# Patient Record
Sex: Female | Born: 1950
Health system: Southern US, Community
[De-identification: ages and names within clinical notes are randomized; demographics above are authoritative.]

## PROBLEM LIST (undated history)

## (undated) DIAGNOSIS — F419 Anxiety disorder, unspecified: Secondary | ICD-10-CM

## (undated) DIAGNOSIS — F329 Major depressive disorder, single episode, unspecified: Secondary | ICD-10-CM

## (undated) DIAGNOSIS — E119 Type 2 diabetes mellitus without complications: Secondary | ICD-10-CM

## (undated) DIAGNOSIS — E039 Hypothyroidism, unspecified: Secondary | ICD-10-CM

## (undated) DIAGNOSIS — E7800 Pure hypercholesterolemia, unspecified: Secondary | ICD-10-CM

## (undated) DIAGNOSIS — K589 Irritable bowel syndrome without diarrhea: Secondary | ICD-10-CM

## (undated) DIAGNOSIS — F32A Depression, unspecified: Secondary | ICD-10-CM

## (undated) DIAGNOSIS — T7840XA Allergy, unspecified, initial encounter: Secondary | ICD-10-CM

## (undated) DIAGNOSIS — I10 Essential (primary) hypertension: Secondary | ICD-10-CM

## (undated) HISTORY — DX: Type 2 diabetes mellitus without complications: E11.9

## (undated) HISTORY — DX: Hypothyroidism, unspecified: E03.9

## (undated) HISTORY — DX: Major depressive disorder, single episode, unspecified: F32.9

## (undated) HISTORY — DX: Essential (primary) hypertension: I10

## (undated) HISTORY — DX: Allergy, unspecified, initial encounter: T78.40XA

## (undated) HISTORY — PX: THYROIDECTOMY: SHX17

## (undated) HISTORY — DX: Irritable bowel syndrome, unspecified: K58.9

## (undated) HISTORY — PX: HERNIA REPAIR: SHX51

## (undated) HISTORY — PX: ABDOMINAL PERINEAL BOWEL RESECTION: SHX1111

## (undated) HISTORY — DX: Depression, unspecified: F32.A

## (undated) HISTORY — PX: APPENDECTOMY: SHX54

## (undated) HISTORY — DX: Anxiety disorder, unspecified: F41.9

## (undated) HISTORY — DX: Pure hypercholesterolemia, unspecified: E78.00

---

## 2008-01-03 ENCOUNTER — Other Ambulatory Visit: Payer: Self-pay

## 2008-01-03 ENCOUNTER — Emergency Department: Payer: Self-pay | Admitting: Emergency Medicine

## 2008-01-20 ENCOUNTER — Ambulatory Visit: Payer: Self-pay | Admitting: Family Medicine

## 2008-02-16 ENCOUNTER — Ambulatory Visit: Payer: Self-pay | Admitting: Family Medicine

## 2008-03-26 ENCOUNTER — Ambulatory Visit: Payer: Self-pay | Admitting: Internal Medicine

## 2008-04-12 ENCOUNTER — Ambulatory Visit: Payer: Self-pay | Admitting: Internal Medicine

## 2008-04-25 ENCOUNTER — Ambulatory Visit: Payer: Self-pay | Admitting: Family Medicine

## 2008-04-25 ENCOUNTER — Ambulatory Visit: Payer: Self-pay | Admitting: Internal Medicine

## 2008-04-27 ENCOUNTER — Ambulatory Visit: Payer: Self-pay | Admitting: Family Medicine

## 2008-05-26 ENCOUNTER — Ambulatory Visit: Payer: Self-pay | Admitting: Internal Medicine

## 2008-06-26 ENCOUNTER — Ambulatory Visit: Payer: Self-pay | Admitting: Internal Medicine

## 2008-07-13 ENCOUNTER — Ambulatory Visit: Payer: Self-pay | Admitting: Internal Medicine

## 2008-07-24 ENCOUNTER — Ambulatory Visit: Payer: Self-pay | Admitting: Internal Medicine

## 2009-01-31 ENCOUNTER — Ambulatory Visit: Payer: Self-pay | Admitting: Family Medicine

## 2009-08-06 ENCOUNTER — Ambulatory Visit: Payer: Self-pay | Admitting: Family Medicine

## 2010-02-05 ENCOUNTER — Ambulatory Visit: Payer: Self-pay | Admitting: Family Medicine

## 2011-03-05 ENCOUNTER — Ambulatory Visit: Payer: Self-pay | Admitting: Family Medicine

## 2011-09-15 ENCOUNTER — Ambulatory Visit: Payer: Self-pay | Admitting: Family Medicine

## 2012-04-13 ENCOUNTER — Ambulatory Visit: Payer: Self-pay | Admitting: Family Medicine

## 2012-07-22 ENCOUNTER — Ambulatory Visit: Payer: Self-pay | Admitting: Family Medicine

## 2012-10-06 ENCOUNTER — Ambulatory Visit: Payer: Self-pay | Admitting: Gastroenterology

## 2012-10-21 LAB — HM PAP SMEAR: HM PAP: NEGATIVE

## 2012-11-22 ENCOUNTER — Ambulatory Visit: Payer: Self-pay | Admitting: Gastroenterology

## 2013-06-07 ENCOUNTER — Ambulatory Visit: Payer: Self-pay | Admitting: Family Medicine

## 2014-04-24 ENCOUNTER — Encounter: Payer: Self-pay | Admitting: Podiatry

## 2014-04-24 ENCOUNTER — Other Ambulatory Visit: Payer: Self-pay | Admitting: *Deleted

## 2014-04-24 ENCOUNTER — Ambulatory Visit (INDEPENDENT_AMBULATORY_CARE_PROVIDER_SITE_OTHER): Payer: Managed Care, Other (non HMO)

## 2014-04-24 ENCOUNTER — Ambulatory Visit (INDEPENDENT_AMBULATORY_CARE_PROVIDER_SITE_OTHER): Payer: Managed Care, Other (non HMO) | Admitting: Podiatry

## 2014-04-24 VITALS — BP 131/81 | HR 76 | Resp 16 | Ht 66.0 in | Wt 220.0 lb

## 2014-04-24 DIAGNOSIS — L603 Nail dystrophy: Secondary | ICD-10-CM

## 2014-04-24 DIAGNOSIS — L600 Ingrowing nail: Secondary | ICD-10-CM

## 2014-04-24 DIAGNOSIS — E119 Type 2 diabetes mellitus without complications: Secondary | ICD-10-CM

## 2014-04-24 MED ORDER — NEOMYCIN-POLYMYXIN-HC 3.5-10000-1 OT SOLN: OTIC | Status: DC

## 2014-04-24 NOTE — Patient Instructions (Signed)

## 2014-04-24 NOTE — Progress Notes (Signed)
   Subjective:    Patient ID: Christine Jensen, female    DOB: 1951-02-18, 63 y.o.   MRN: [id number]  HPI Comments: Toenail is discolored and mishaped. No pain. Have used a topical before. It has spread to the finger nails  Diabetes and the last a1c was a 6.2      Review of Systems  Constitutional: Positive for fatigue and unexpected weight change.  Gastrointestinal: Positive for abdominal pain and constipation.  Allergic/Immunologic: Positive for environmental allergies and food allergies.  Hematological: Bruises/bleeds easily.       Objective:   Physical Exam: I have reviewed her past medical history medications allergies surgery social history and review of systems. Pulses are strongly palpable bilateral. Neurologic sensorium is intact per Semmes-Weinstein monofilament. Deep tendon reflexes are intact bilateral muscle strength is 5 over 5 dorsiflexion plantar flexors and inverters and everters all intrinsic musculature is intact. Orthopedic evaluation and straights all joints distal to the ankle for range of motion without crepitation. Cutaneous evaluation demonstrates hallux nail plate left with a subungual contusion floating on a serosanguineous fluid collection. The nail does appear to be thickened and discolored possibly mycotic as well.        Assessment & Plan:  Assessment: Subungual abscess ingrown nail hallux left. Rule out onychomycosis.  Plan: Total nail avulsion was performed today to the hallux left after local anesthetic was administered. She tolerated the procedure well and will start soaking twice daily tomorrow and Betadine warm water. The nail plate will be sent for pathologic evaluation and we will follow-up with her in 1 week

## 2014-04-25 ENCOUNTER — Telehealth: Payer: Self-pay | Admitting: *Deleted

## 2014-04-25 NOTE — Telephone Encounter (Signed)
Pt called and stated her pharmacy [facility] [address] called her and told her ear drops were ready to be picked up. Pt and i discussed that dr Milinda Pointer did not use the chemical and the pt stated that dr Milinda Pointer told her the nail would grow back. Told pt she did not need to pick up drops. Pt understood.

## 2014-05-08 ENCOUNTER — Ambulatory Visit (INDEPENDENT_AMBULATORY_CARE_PROVIDER_SITE_OTHER): Payer: Managed Care, Other (non HMO) | Admitting: Podiatry

## 2014-05-08 ENCOUNTER — Encounter: Payer: Self-pay | Admitting: Podiatry

## 2014-05-08 VITALS — BP 128/80 | HR 84 | Resp 16

## 2014-05-08 DIAGNOSIS — Z79899 Other long term (current) drug therapy: Secondary | ICD-10-CM

## 2014-05-08 MED ORDER — TERBINAFINE HCL 250 MG PO TABS: 250.0000 mg | ORAL_TABLET | Freq: Every day | ORAL | Status: DC

## 2014-05-08 NOTE — Progress Notes (Signed)
She presents today for follow-up of nail avulsion hallux left. Her pathology report did come back positive for fungus however she does not want to take anything orally.  Objective: Vital signs are stable she is alert and oriented 3. No erythema or edema cellulitis drainage or odor to nail avulsion site hallux left.  Assessment: Well-healed surgical toe hallux left.  Plan: Discussed onychomycosis and is also pathology with her today however she does not want take any oral therapy.

## 2014-06-06 DIAGNOSIS — Z630 Problems in relationship with spouse or partner: Secondary | ICD-10-CM | POA: Diagnosis not present

## 2014-06-08 DIAGNOSIS — G629 Polyneuropathy, unspecified: Secondary | ICD-10-CM | POA: Diagnosis not present

## 2014-06-08 DIAGNOSIS — E785 Hyperlipidemia, unspecified: Secondary | ICD-10-CM | POA: Diagnosis not present

## 2014-06-08 DIAGNOSIS — E1140 Type 2 diabetes mellitus with diabetic neuropathy, unspecified: Secondary | ICD-10-CM | POA: Diagnosis not present

## 2014-06-08 DIAGNOSIS — F418 Other specified anxiety disorders: Secondary | ICD-10-CM | POA: Diagnosis not present

## 2014-06-08 DIAGNOSIS — I10 Essential (primary) hypertension: Secondary | ICD-10-CM | POA: Diagnosis not present

## 2014-06-08 DIAGNOSIS — G4700 Insomnia, unspecified: Secondary | ICD-10-CM | POA: Diagnosis not present

## 2014-06-20 DIAGNOSIS — Z630 Problems in relationship with spouse or partner: Secondary | ICD-10-CM | POA: Diagnosis not present

## 2014-07-06 DIAGNOSIS — Z630 Problems in relationship with spouse or partner: Secondary | ICD-10-CM | POA: Diagnosis not present

## 2014-07-27 DIAGNOSIS — Z658 Other specified problems related to psychosocial circumstances: Secondary | ICD-10-CM | POA: Diagnosis not present

## 2014-08-01 ENCOUNTER — Ambulatory Visit: Payer: Self-pay | Admitting: Family Medicine

## 2014-08-01 DIAGNOSIS — Z1231 Encounter for screening mammogram for malignant neoplasm of breast: Secondary | ICD-10-CM | POA: Diagnosis not present

## 2014-08-03 DIAGNOSIS — L301 Dyshidrosis [pompholyx]: Secondary | ICD-10-CM | POA: Diagnosis not present

## 2014-08-03 DIAGNOSIS — L400 Psoriasis vulgaris: Secondary | ICD-10-CM | POA: Diagnosis not present

## 2014-08-10 DIAGNOSIS — F411 Generalized anxiety disorder: Secondary | ICD-10-CM | POA: Diagnosis not present

## 2014-08-17 ENCOUNTER — Ambulatory Visit: Payer: Self-pay | Admitting: Gastroenterology

## 2014-08-17 DIAGNOSIS — K648 Other hemorrhoids: Secondary | ICD-10-CM | POA: Diagnosis not present

## 2014-08-17 DIAGNOSIS — Z91048 Other nonmedicinal substance allergy status: Secondary | ICD-10-CM | POA: Diagnosis not present

## 2014-08-17 DIAGNOSIS — Z888 Allergy status to other drugs, medicaments and biological substances status: Secondary | ICD-10-CM | POA: Diagnosis not present

## 2014-08-17 DIAGNOSIS — Z885 Allergy status to narcotic agent status: Secondary | ICD-10-CM | POA: Diagnosis not present

## 2014-08-17 DIAGNOSIS — K921 Melena: Secondary | ICD-10-CM | POA: Diagnosis not present

## 2014-08-17 DIAGNOSIS — Z880 Allergy status to penicillin: Secondary | ICD-10-CM | POA: Diagnosis not present

## 2014-08-17 DIAGNOSIS — D120 Benign neoplasm of cecum: Secondary | ICD-10-CM | POA: Diagnosis not present

## 2014-08-17 DIAGNOSIS — K589 Irritable bowel syndrome without diarrhea: Secondary | ICD-10-CM | POA: Diagnosis not present

## 2014-08-17 DIAGNOSIS — Z91018 Allergy to other foods: Secondary | ICD-10-CM | POA: Diagnosis not present

## 2014-08-17 DIAGNOSIS — Z882 Allergy status to sulfonamides status: Secondary | ICD-10-CM | POA: Diagnosis not present

## 2014-08-28 DIAGNOSIS — L400 Psoriasis vulgaris: Secondary | ICD-10-CM | POA: Diagnosis not present

## 2014-08-30 DIAGNOSIS — L400 Psoriasis vulgaris: Secondary | ICD-10-CM | POA: Diagnosis not present

## 2014-08-30 DIAGNOSIS — R1011 Right upper quadrant pain: Secondary | ICD-10-CM | POA: Diagnosis not present

## 2014-08-30 DIAGNOSIS — G8929 Other chronic pain: Secondary | ICD-10-CM | POA: Diagnosis not present

## 2014-08-30 DIAGNOSIS — M5414 Radiculopathy, thoracic region: Secondary | ICD-10-CM | POA: Diagnosis not present

## 2014-08-30 DIAGNOSIS — B0229 Other postherpetic nervous system involvement: Secondary | ICD-10-CM | POA: Diagnosis not present

## 2014-09-01 DIAGNOSIS — F411 Generalized anxiety disorder: Secondary | ICD-10-CM | POA: Diagnosis not present

## 2014-09-04 DIAGNOSIS — L400 Psoriasis vulgaris: Secondary | ICD-10-CM | POA: Diagnosis not present

## 2014-09-05 ENCOUNTER — Ambulatory Visit
Admit: 2014-09-05 | Disposition: A | Discharge status: Home / Self Care | Payer: Self-pay | Attending: Family Medicine | Admitting: Family Medicine

## 2014-09-05 DIAGNOSIS — R1011 Right upper quadrant pain: Secondary | ICD-10-CM | POA: Diagnosis not present

## 2014-09-05 DIAGNOSIS — K760 Fatty (change of) liver, not elsewhere classified: Secondary | ICD-10-CM | POA: Diagnosis not present

## 2014-09-06 DIAGNOSIS — L400 Psoriasis vulgaris: Secondary | ICD-10-CM | POA: Diagnosis not present

## 2014-09-11 DIAGNOSIS — L400 Psoriasis vulgaris: Secondary | ICD-10-CM | POA: Diagnosis not present

## 2014-09-13 DIAGNOSIS — L400 Psoriasis vulgaris: Secondary | ICD-10-CM | POA: Diagnosis not present

## 2014-09-15 DIAGNOSIS — E7800 Pure hypercholesterolemia, unspecified: Secondary | ICD-10-CM | POA: Insufficient documentation

## 2014-09-15 DIAGNOSIS — F32A Depression, unspecified: Secondary | ICD-10-CM

## 2014-09-15 DIAGNOSIS — F329 Major depressive disorder, single episode, unspecified: Secondary | ICD-10-CM

## 2014-09-15 DIAGNOSIS — E039 Hypothyroidism, unspecified: Secondary | ICD-10-CM | POA: Insufficient documentation

## 2014-09-15 DIAGNOSIS — F419 Anxiety disorder, unspecified: Principal | ICD-10-CM

## 2014-09-15 DIAGNOSIS — F411 Generalized anxiety disorder: Secondary | ICD-10-CM | POA: Diagnosis not present

## 2014-09-18 DIAGNOSIS — L400 Psoriasis vulgaris: Secondary | ICD-10-CM | POA: Diagnosis not present

## 2014-09-19 ENCOUNTER — Ambulatory Visit
Admit: 2014-09-19 | Disposition: A | Discharge status: Home / Self Care | Payer: Self-pay | Attending: Family Medicine | Admitting: Family Medicine

## 2014-09-19 DIAGNOSIS — M40204 Unspecified kyphosis, thoracic region: Secondary | ICD-10-CM | POA: Diagnosis not present

## 2014-09-19 DIAGNOSIS — M546 Pain in thoracic spine: Secondary | ICD-10-CM | POA: Diagnosis not present

## 2014-09-19 DIAGNOSIS — M5124 Other intervertebral disc displacement, thoracic region: Secondary | ICD-10-CM | POA: Diagnosis not present

## 2014-09-20 DIAGNOSIS — L400 Psoriasis vulgaris: Secondary | ICD-10-CM | POA: Diagnosis not present

## 2014-09-25 DIAGNOSIS — L400 Psoriasis vulgaris: Secondary | ICD-10-CM | POA: Diagnosis not present

## 2014-09-27 DIAGNOSIS — L400 Psoriasis vulgaris: Secondary | ICD-10-CM | POA: Diagnosis not present

## 2014-10-04 DIAGNOSIS — Z630 Problems in relationship with spouse or partner: Secondary | ICD-10-CM | POA: Diagnosis not present

## 2014-10-10 DIAGNOSIS — E1140 Type 2 diabetes mellitus with diabetic neuropathy, unspecified: Secondary | ICD-10-CM | POA: Diagnosis not present

## 2014-10-10 DIAGNOSIS — E669 Obesity, unspecified: Secondary | ICD-10-CM | POA: Diagnosis not present

## 2014-10-10 DIAGNOSIS — B0229 Other postherpetic nervous system involvement: Secondary | ICD-10-CM | POA: Diagnosis not present

## 2014-10-10 DIAGNOSIS — F418 Other specified anxiety disorders: Secondary | ICD-10-CM | POA: Diagnosis not present

## 2014-10-10 DIAGNOSIS — Z713 Dietary counseling and surveillance: Secondary | ICD-10-CM | POA: Diagnosis not present

## 2014-10-10 DIAGNOSIS — R222 Localized swelling, mass and lump, trunk: Secondary | ICD-10-CM | POA: Diagnosis not present

## 2014-10-10 DIAGNOSIS — M546 Pain in thoracic spine: Secondary | ICD-10-CM | POA: Diagnosis not present

## 2014-10-10 DIAGNOSIS — R110 Nausea: Secondary | ICD-10-CM | POA: Diagnosis not present

## 2014-10-20 DIAGNOSIS — Z658 Other specified problems related to psychosocial circumstances: Secondary | ICD-10-CM | POA: Diagnosis not present

## 2014-10-24 DIAGNOSIS — M5414 Radiculopathy, thoracic region: Secondary | ICD-10-CM | POA: Diagnosis not present

## 2014-10-24 DIAGNOSIS — M546 Pain in thoracic spine: Secondary | ICD-10-CM | POA: Diagnosis not present

## 2014-10-24 DIAGNOSIS — Z6833 Body mass index (BMI) 33.0-33.9, adult: Secondary | ICD-10-CM | POA: Diagnosis not present

## 2014-10-31 ENCOUNTER — Encounter: Payer: Self-pay | Admitting: Obstetrics and Gynecology

## 2014-11-10 ENCOUNTER — Ambulatory Visit (INDEPENDENT_AMBULATORY_CARE_PROVIDER_SITE_OTHER): Payer: Medicare Other | Admitting: Licensed Clinical Social Worker

## 2014-11-10 DIAGNOSIS — F331 Major depressive disorder, recurrent, moderate: Secondary | ICD-10-CM | POA: Diagnosis not present

## 2014-11-10 DIAGNOSIS — F411 Generalized anxiety disorder: Secondary | ICD-10-CM | POA: Diagnosis not present

## 2014-11-10 NOTE — Progress Notes (Signed)
   THERAPIST PROGRESS NOTE  Session Time: 1:00 p.m. -  2:20 p.m.  Participation Level: Active  Behavioral Response: NeatAlertAnxious and Depressed  Type of Therapy: Individual Therapy  Treatment Goals addressed: Coping  Interventions: Strength-based, Supportive and Reframing  Summary: Christine Jensen is a 64 y.o. female who presents with ongoing depression and anxiety further exacerbated by care-giving responsibilities of spouse with cognitive impairments that often result in behavioral difficulties.  "It's gotten worse but I think I'm coping with it better." Recent disappointment around feeling a sense of loss in a long term relationship with female peer who has promised to visit client in [place] for years.  There was not follow through despite the friend and her family making multiple trips that had them coming through [facility].  Christine Jensen sent the friend an e-mail expressing her frustration and disappointment. This has resulted per client in feeling more alone since she shared her emotions with the friend since her husband's condition has worsened. "I no longer have my best friend."  On a positive note, client is looking forward to a visit from her niece in July and client and friend who lives in [place] may take a trip with the husband to [place], [place].  In addition, their son continues to enjoy school and is attending summer school and the two of them have had interesting conversations around the topics of his projects.  As she and son become closer, this has caused her spouse to notice that he may not be as included in their conversations as he was in the past per client.  Other themes discussed around whether or not her expectations of others are realistic or not which led to Christine Jensen sharing more about family of origin where both parents had very high expectations and client now questioning "Were they wrong?"  She reflected on her generation and the social and political challenges faced and how she has  difficulty accepting the apathy of current generations and conformist beliefs. Grief reactions and loneliness/social isolation are additional areas negatively impacting Christine Jensen's life.  Client talked about needing additional information and emotional support for herself as husband's care-giver.  She shared that her spouse receives some type of treatment at [facility] that is specifically for survivors of 911 yet her husband is unable to remember to ask about family support groups.  No additional concerns or changes in overall symptom presentation remains slightly improved except her sleep cycle.  Christine Jensen was not as tearful in session today as she has been in previous sessions.  Suicidal/Homicidal: Negativewithout intent/plan  Therapist Response:  LCSW encouraged sharing feelings of depression in order to clarify them and gain insight as to causes. Supportive therapy with insight and focused on client's strengths and resourcefulness while also validating the emotional, social and physical toile this is taking on her. LCSW offered education about common irrational fears and beliefs that contribute to anxiety.  Assisted client to begin to increase insight and identification into patterns of certain behaviors and the resulting consequences.  Psycho-education on understanding disappointment in others and strategies for letting go of negative beliefs and feelings attached to these relationships to become healthier.   Plan: Return again in two weeks.  Diagnosis: Major Depressive Disorder, Recurrent, Moderate   Generalized Anxiety Disorder    Miguel Dibble, LCSW 11/10/2014

## 2014-11-20 ENCOUNTER — Encounter: Payer: Self-pay | Admitting: *Deleted

## 2014-11-22 ENCOUNTER — Ambulatory Visit (INDEPENDENT_AMBULATORY_CARE_PROVIDER_SITE_OTHER): Payer: Medicare Other | Admitting: Obstetrics and Gynecology

## 2014-11-22 ENCOUNTER — Encounter: Payer: Self-pay | Admitting: Obstetrics and Gynecology

## 2014-11-22 VITALS — BP 109/79 | HR 82 | Ht 66.0 in | Wt 209.6 lb

## 2014-11-22 DIAGNOSIS — K6289 Other specified diseases of anus and rectum: Secondary | ICD-10-CM

## 2014-11-22 DIAGNOSIS — Z01419 Encounter for gynecological examination (general) (routine) without abnormal findings: Secondary | ICD-10-CM | POA: Diagnosis not present

## 2014-11-22 DIAGNOSIS — Z0000 Encounter for general adult medical examination without abnormal findings: Secondary | ICD-10-CM | POA: Diagnosis not present

## 2014-11-22 MED ORDER — CLOBETASOL PROPIONATE 0.05 % EX OINT: 1.0000 "application " | TOPICAL_OINTMENT | Freq: Two times a day (BID) | CUTANEOUS | Status: DC

## 2014-11-22 NOTE — Patient Instructions (Signed)
Fall Prevention and Home Safety Falls cause injuries and can affect all age groups. It is possible to prevent falls.  HOW TO PREVENT FALLS  Wear shoes with rubber soles that do not have an opening for your toes.  Keep the inside and outside of your house well lit.  Use night lights throughout your home.  Remove clutter from floors.  Clean up floor spills.  Remove throw rugs or fasten them to the floor with carpet tape.  Do not place electrical cords across pathways.  Put grab bars by your tub, shower, and toilet. Do not use towel bars as grab bars.  Put handrails on both sides of the stairway. Fix loose handrails.  Do not climb on stools or stepladders, if possible.  Do not wax your floors.  Repair uneven or unsafe sidewalks, walkways, or stairs.  Keep items you use a lot within reach.  Be aware of pets.  Keep emergency numbers next to the telephone.  Put smoke detectors in your home and near bedrooms. Ask your doctor what other things you can do to prevent falls. Document Released: 03/08/2009 Document Revised: 11/11/2011 Document Reviewed: 08/12/2011 [facility] Patient Information 2015 [place], [place]. This information is not intended to replace advice given to you by your health care provider. Make sure you discuss any questions you have with your health care provider.  Health Maintenance Adopting a healthy lifestyle and getting preventive care can go a long way to promote health and wellness. Talk with your health care provider about what schedule of regular examinations is right for you. This is a good chance for you to check in with your provider about disease prevention and staying healthy. In between checkups, there are plenty of things you can do on your own. Experts have done a lot of research about which lifestyle changes and preventive measures are most likely to keep you healthy. Ask your health care provider for more information. WEIGHT AND DIET  Eat a healthy  diet  Be sure to include plenty of vegetables, fruits, low-fat dairy products, and lean protein.  Do not eat a lot of foods high in solid fats, added sugars, or salt.  Get regular exercise. This is one of the most important things you can do for your health.  Most adults should exercise for at least 150 minutes each week. The exercise should increase your heart rate and make you sweat (moderate-intensity exercise).  Most adults should also do strengthening exercises at least twice a week. This is in addition to the moderate-intensity exercise.  Maintain a healthy weight  Body mass index (BMI) is a measurement that can be used to identify possible weight problems. It estimates body fat based on height and weight. Your health care provider can help determine your BMI and help you achieve or maintain a healthy weight.  For females 29 years of age and older:   A BMI below 18.5 is considered underweight.  A BMI of 18.5 to 24.9 is normal.  A BMI of 25 to 29.9 is considered overweight.  A BMI of 30 and above is considered obese.  Watch levels of cholesterol and blood lipids  You should start having your blood tested for lipids and cholesterol at 65 years of age, then have this test every 5 years.  You may need to have your cholesterol levels checked more often if:  Your lipid or cholesterol levels are high.  You are older than 65 years of age.  You are at high risk for  heart disease.  CANCER SCREENING   Lung Cancer  Lung cancer screening is recommended for adults 34-57 years old who are at high risk for lung cancer because of a history of smoking.  A yearly low-dose CT scan of the lungs is recommended for people who:  Currently smoke.  Have quit within the past 15 years.  Have at least a 30-pack-year history of smoking. A pack year is smoking an average of one pack of cigarettes a day for 1 year.  Yearly screening should continue until it has been 15 years since you  quit.  Yearly screening should stop if you develop a health problem that would prevent you from having lung cancer treatment.  Breast Cancer  Practice breast self-awareness. This means understanding how your breasts normally appear and feel.  It also means doing regular breast self-exams. Let your health care provider know about any changes, no matter how small.  If you are in your 20s or 30s, you should have a clinical breast exam (CBE) by a health care provider every 1-3 years as part of a regular health exam.  If you are 90 or older, have a CBE every year. Also consider having a breast X-ray (mammogram) every year.  If you have a family history of breast cancer, talk to your health care provider about genetic screening.  If you are at high risk for breast cancer, talk to your health care provider about having an MRI and a mammogram every year.  Breast cancer gene (BRCA) assessment is recommended for women who have family members with BRCA-related cancers. BRCA-related cancers include:  Breast.  Ovarian.  Tubal.  Peritoneal cancers.  Results of the assessment will determine the need for genetic counseling and BRCA1 and BRCA2 testing. Cervical Cancer Routine pelvic examinations to screen for cervical cancer are no longer recommended for nonpregnant women who are considered low risk for cancer of the pelvic organs (ovaries, uterus, and vagina) and who do not have symptoms. A pelvic examination may be necessary if you have symptoms including those associated with pelvic infections. Ask your health care provider if a screening pelvic exam is right for you.   The Pap test is the screening test for cervical cancer for women who are considered at risk.  If you had a hysterectomy for a problem that was not cancer or a condition that could lead to cancer, then you no longer need Pap tests.  If you are older than 65 years, and you have had normal Pap tests for the past 10 years, you no  longer need to have Pap tests.  If you have had past treatment for cervical cancer or a condition that could lead to cancer, you need Pap tests and screening for cancer for at least 20 years after your treatment.  If you no longer get a Pap test, assess your risk factors if they change (such as having a new sexual partner). This can affect whether you should start being screened again.  Some women have medical problems that increase their chance of getting cervical cancer. If this is the case for you, your health care provider may recommend more frequent screening and Pap tests.  The human papillomavirus (HPV) test is another test that may be used for cervical cancer screening. The HPV test looks for the virus that can cause cell changes in the cervix. The cells collected during the Pap test can be tested for HPV.  The HPV test can be used to screen women 30  years of age and older. Getting tested for HPV can extend the interval between normal Pap tests from three to five years.  An HPV test also should be used to screen women of any age who have unclear Pap test results.  After 65 years of age, women should have HPV testing as often as Pap tests.  Colorectal Cancer  This type of cancer can be detected and often prevented.  Routine colorectal cancer screening usually begins at 65 years of age and continues through 65 years of age.  Your health care provider may recommend screening at an earlier age if you have risk factors for colon cancer.  Your health care provider may also recommend using home test kits to check for hidden blood in the stool.  A small camera at the end of a tube can be used to examine your colon directly (sigmoidoscopy or colonoscopy). This is done to check for the earliest forms of colorectal cancer.  Routine screening usually begins at age 46.  Direct examination of the colon should be repeated every 5-10 years through 65 years of age. However, you may need to be  screened more often if early forms of precancerous polyps or small growths are found. Skin Cancer  Check your skin from head to toe regularly.  Tell your health care provider about any new moles or changes in moles, especially if there is a change in a mole's shape or color.  Also tell your health care provider if you have a mole that is larger than the size of a pencil eraser.  Always use sunscreen. Apply sunscreen liberally and repeatedly throughout the day.  Protect yourself by wearing long sleeves, pants, a wide-brimmed hat, and sunglasses whenever you are outside. HEART DISEASE, DIABETES, AND HIGH BLOOD PRESSURE   Have your blood pressure checked at least every 1-2 years. High blood pressure causes heart disease and increases the risk of stroke.  If you are between 29 years and 75 years old, ask your health care provider if you should take aspirin to prevent strokes.  Have regular diabetes screenings. This involves taking a blood sample to check your fasting blood sugar level.  If you are at a normal weight and have a low risk for diabetes, have this test once every three years after 65 years of age.  If you are overweight and have a high risk for diabetes, consider being tested at a younger age or more often. PREVENTING INFECTION  Hepatitis B  If you have a higher risk for hepatitis B, you should be screened for this virus. You are considered at high risk for hepatitis B if:  You were born in a country where hepatitis B is common. Ask your health care provider which countries are considered high risk.  Your parents were born in a high-risk country, and you have not been immunized against hepatitis B (hepatitis B vaccine).  You have HIV or AIDS.  You use needles to inject street drugs.  You live with someone who has hepatitis B.  You have had sex with someone who has hepatitis B.  You get hemodialysis treatment.  You take certain medicines for conditions, including  cancer, organ transplantation, and autoimmune conditions. Hepatitis C  Blood testing is recommended for:  Everyone born from [birth year] through 1966.  Anyone with known risk factors for hepatitis C. Sexually transmitted infections (STIs)  You should be screened for sexually transmitted infections (STIs) including gonorrhea and chlamydia if:  You are sexually active and are  younger than 65 years of age.  You are older than 65 years of age and your health care provider tells you that you are at risk for this type of infection.  Your sexual activity has changed since you were last screened and you are at an increased risk for chlamydia or gonorrhea. Ask your health care provider if you are at risk.  If you do not have HIV, but are at risk, it may be recommended that you take a prescription medicine daily to prevent HIV infection. This is called pre-exposure prophylaxis (PrEP). You are considered at risk if:  You are sexually active and do not regularly use condoms or know the HIV status of your partner(s).  You take drugs by injection.  You are sexually active with a partner who has HIV. Talk with your health care provider about whether you are at high risk of being infected with HIV. If you choose to begin PrEP, you should first be tested for HIV. You should then be tested every 3 months for as long as you are taking PrEP.  PREGNANCY   If you are premenopausal and you may become pregnant, ask your health care provider about preconception counseling.  If you may become pregnant, take 400 to 800 micrograms (mcg) of folic acid every day.  If you want to prevent pregnancy, talk to your health care provider about birth control (contraception). OSTEOPOROSIS AND MENOPAUSE   Osteoporosis is a disease in which the bones lose minerals and strength with aging. This can result in serious bone fractures. Your risk for osteoporosis can be identified using a bone density scan.  If you are 26 years of  age or older, or if you are at risk for osteoporosis and fractures, ask your health care provider if you should be screened.  Ask your health care provider whether you should take a calcium or vitamin D supplement to lower your risk for osteoporosis.  Menopause may have certain physical symptoms and risks.  Hormone replacement therapy may reduce some of these symptoms and risks. Talk to your health care provider about whether hormone replacement therapy is right for you.  HOME CARE INSTRUCTIONS   Schedule regular health, dental, and eye exams.  Stay current with your immunizations.   Do not use any tobacco products including cigarettes, chewing tobacco, or electronic cigarettes.  If you are pregnant, do not drink alcohol.  If you are breastfeeding, limit how much and how often you drink alcohol.  Limit alcohol intake to no more than 1 drink per day for nonpregnant women. One drink equals 12 ounces of beer, 5 ounces of wine, or 1 ounces of hard liquor.  Do not use street drugs.  Do not share needles.  Ask your health care provider for help if you need support or information about quitting drugs.  Tell your health care provider if you often feel depressed.  Tell your health care provider if you have ever been abused or do not feel safe at home. Document Released: 11/25/2010 Document Revised: 09/26/2013 Document Reviewed: 04/13/2013 [facility] Patient Information 2015 [place], [place]. This information is not intended to replace advice given to you by your health care provider. Make sure you discuss any questions you have with your health care provider.  Mammography Mammography is an X-ray of the breasts to look for changes that are not normal. The X-ray image is called a mammogram. This procedure can screen for breast cancer, can detect cancer early, and can diagnose cancer.  LET YOUR  CAREGIVER KNOW ABOUT:  Breast implants.  Previous breast disease, biopsy, or surgery.  If you  are breastfeeding.  Medicines taken, including vitamins, herbs, eyedrops, over-the-counter medicines, and creams.  Use of steroids (by mouth or creams).  Possibility of pregnancy, if this applies. RISKS AND COMPLICATIONS  Exposure to radiation, but at very low levels.  The results may be misinterpreted.  The results may not be accurate.  Mammography may lead to further tests.  Mammography may not catch certain cancers. BEFORE THE PROCEDURE  Schedule your test about 7 days after your menstrual period. This is when your breasts are the least tender and have signs of hormone changes.  If you have had a mammography done at a different facility in the past, get the mammogram X-rays or have them sent to your current exam facility in order to compare them.  Wash your breasts and under your arms the day of the test.  Do not wear deodorants, perfumes, or powders anywhere on your body.  Wear clothes that you can change in and out of easily. PROCEDURE Relax as much as possible during the test. Any discomfort during the test will be very brief. The test should take less than 30 minutes. The following will happen:  You will undress from the waist up and put on a gown.  You will stand in front of the X-ray machine.  Each breast will be placed between 2 plastic or glass plates. The plates will compress your breast for a few seconds.  X-rays will be taken from different angles of the breast. AFTER THE PROCEDURE  The mammogram will be examined.  Depending on the quality of the images, you may need to repeat certain parts of the test.  Ask when your test results will be ready. Make sure you get your test results.  You may resume normal activities. Document Released: 05/09/2000 Document Revised: 08/04/2011 Document Reviewed: 03/02/2011 [facility] Patient Information 2015 [place], [place]. This information is not intended to replace advice given to you by your health care provider. Make  sure you discuss any questions you have with your health care provider.

## 2014-11-22 NOTE — Progress Notes (Signed)
Subjective:    Christine Jensen is a 63 y.o. female who presents for Medicare Annual/Subsequent preventive examination.  Preventive Screening-Counseling & Management  Tobacco History  Smoking status  . Never Smoker   Smokeless tobacco  . Never Used     Problems Prior to Visit 1.   Current Problems (verified) There are no active problems to display for this patient.   Medications Prior to Visit Current Outpatient Prescriptions on File Prior to Visit  Medication Sig Dispense Refill  . clonazePAM (KLONOPIN) 0.5 MG tablet Take 0.5 mg by mouth at bedtime.    Marland Kitchen diltiazem (DILACOR XR) 180 MG 24 hr capsule Take 180 mg by mouth daily.    Marland Kitchen glyBURIDE-metformin (GLUCOVANCE) 2.5-500 MG per tablet Take 1 tablet by mouth daily with breakfast.    . levothyroxine (SYNTHROID, LEVOTHROID) 112 MCG tablet Take 112 mcg by mouth daily before breakfast.    . losartan-hydrochlorothiazide (HYZAAR) 50-12.5 MG per tablet Take 1 tablet by mouth daily.    Marland Kitchen pyrithione zinc (HEAD AND SHOULDERS) 1 % shampoo Apply topically daily as needed for itching.    . zolpidem (AMBIEN) 10 MG tablet Take 10 mg by mouth at bedtime as needed for sleep.     No current facility-administered medications on file prior to visit.    Current Medications (verified) Current Outpatient Prescriptions  Medication Sig Dispense Refill  . clonazePAM (KLONOPIN) 0.5 MG tablet Take 0.5 mg by mouth at bedtime.    Marland Kitchen diltiazem (DILACOR XR) 180 MG 24 hr capsule Take 180 mg by mouth daily.    Marland Kitchen gabapentin (NEURONTIN) 100 MG capsule Take 100 mg by mouth 3 (three) times daily.    Marland Kitchen glyBURIDE-metformin (GLUCOVANCE) 2.5-500 MG per tablet Take 1 tablet by mouth daily with breakfast.    . levothyroxine (SYNTHROID, LEVOTHROID) 112 MCG tablet Take 112 mcg by mouth daily before breakfast.    . losartan-hydrochlorothiazide (HYZAAR) 50-12.5 MG per tablet Take 1 tablet by mouth daily.    Marland Kitchen pyrithione zinc (HEAD AND SHOULDERS) 1 % shampoo Apply topically  daily as needed for itching.    . zolpidem (AMBIEN) 10 MG tablet Take 10 mg by mouth at bedtime as needed for sleep.     No current facility-administered medications for this visit.     Allergies (verified) Codeine; Erythromycin; Penicillins; and Sulfa antibiotics   PAST HISTORY  Family History Family History  Problem Relation Age of Onset  . Heart disease Mother   . Alzheimer's disease Mother   . Arthritis Father     Social History History  Substance Use Topics  . Smoking status: Never Smoker   . Smokeless tobacco: Never Used  . Alcohol Use: Yes     Comment: occas     Are there smokers in your home (other than you)? No  Risk Factors Current exercise habits: Exercise is limited by orthopedic condition(s): back pain due to benign tumor.  Dietary issues discussed: low fat low carb diet   Cardiac risk factors: advanced age (older than 4 for men, 57 for women), diabetes mellitus and sedentary lifestyle.  Depression Screen (Note: if answer to either of the following is "Yes", a more complete depression screening is indicated)   Over the past two weeks, have you felt down, depressed or hopeless? Yes  Over the past two weeks, have you felt little interest or pleasure in doing things? Yes  Have you lost interest or pleasure in daily life? No  Do you often feel hopeless? No  Do you cry  easily over simple problems? No  Activities of Daily Living In your present state of health, do you have any difficulty performing the following activities?:  Driving? No Managing money?  No Feeding yourself? No Getting from bed to chair? NoNo exam performed today, patient refused exam. Climbing a flight of stairs? No Preparing food and eating?: No Bathing or showering? No Getting dressed: No Getting to the toilet? No Using the toilet:No Moving around from place to place: No In the past year have you fallen or had a near fall?:No   Are you sexually active?  No  Do you have more than  one partner?  No  Hearing Difficulties: No Do you often ask people to speak up or repeat themselves? No Do you experience ringing or noises in your ears? No Do you have difficulty understanding soft or whispered voices? No   Do you feel that you have a problem with memory? No  Do you often misplace items? No  Do you feel safe at home?  Yes  Cognitive Testing  Alert? Yes  Normal Appearance?Yes  Oriented to person? Yes  Place? Yes   Time? Yes  Recall of three objects?  Yes  Can perform simple calculations? Yes  Displays appropriate judgment?Yes  Can read the correct time from a watch face?Yes   Advanced Directives have been discussed with the patient? No  List the Names of Other Physician/Practitioners you currently use: 1.    Indicate any recent Medical Services you may have received from other than [facility] providers in the past year (date may be approximate).   There is no immunization history on file for this patient.  Screening Tests Health Maintenance  Topic Date Due  . HIV Screening  06/19/1964  . TETANUS/TDAP  06/19/1968  . MAMMOGRAM  06/20/1999  . COLONOSCOPY  06/20/1999  . ZOSTAVAX  06/19/2009  . DEXA SCAN  06/19/2014  . PNA vac Low Risk Adult (1 of 2 - PCV13) 06/19/2014  . INFLUENZA VACCINE  12/25/2014    All answers were reviewed with the patient and necessary referrals were made:  [place], CNM   11/22/2014   History reviewed: allergies, current medications, past family history, past medical history, past social history, past surgical history and problem list  Review of Systems Genitourinary:negative except for rectal itching and skin breakdown    Objective:     Vision by Snellen chart: right SWN:IOEVOJJ declines measurement, left KKX:FGHWEXH declines measurement  Body mass index is 33.85 kg/(m^2). BP 109/79 mmHg  Pulse 82  Ht 5\' 6"  (1.676 m)  Wt 209 lb 9.6 oz (95.074 kg)  BMI 33.85 kg/m2  BP 109/79 mmHg  Pulse 82  Ht 5\' 6"  (1.676 m)  Wt 209  lb 9.6 oz (95.074 kg)  BMI 33.85 kg/m2  General Appearance:    Alert, cooperative, no distress, appears stated age  Head:    Normocephalic, without obvious abnormality, atraumatic  Eyes:    PERRL, conjunctiva/corneas clear, EOM's intact, fundi    benign, both eyes  Ears:    Normal TM's and external ear canals, both ears  Nose:   Nares normal, septum midline, mucosa normal, no drainage    or sinus tenderness  Throat:   Lips, mucosa, and tongue normal; teeth and gums normal  Neck:   Supple, symmetrical, trachea midline, no adenopathy;    thyroid:  no enlargement/tenderness/nodules; no carotid   bruit or JVD  Back:     Symmetric, no curvature, ROM normal, no CVA tenderness  Lungs:  Clear to auscultation bilaterally, respirations unlabored  Chest Wall:    No tenderness or deformity   Heart:    Regular rate and rhythm, S1 and S2 normal, no murmur, rub   or gallop  Breast Exam:    No tenderness, masses, or nipple abnormality  Abdomen:     Soft, non-tender, bowel sounds active all four quadrants,    no masses, no organomegaly  Genitalia:    Normal female without lesion, discharge or tenderness  Rectal:    Normal tone, normal prostate, no masses or tenderness;   guaiac negative stool, with erythemia from rectal opening to gluteal areas bilaterally  Extremities:   Extremities normal, atraumatic, no cyanosis or edema  Pulses:   2+ and symmetric all extremities  Skin:   Skin color, texture, turgor normal, no rashes or lesions  Lymph nodes:   Cervical, supraclavicular, and axillary nodes normal  Neurologic:   CNII-XII intact, normal strength, sensation and reflexes    throughout       Assessment:     Routine postmenopausal findings. Rectal errythemia      Plan:     During the course of the visit the patient was educated and counseled about appropriate screening and preventive services including:    Screening mammography  Diet review for nutrition referral? Yes ____  Not  Indicated __X__   Patient Instructions (the written plan) was given to the patient.  Medicare Attestation I have personally reviewed: The patient's medical and social history Their use of alcohol, tobacco or illicit drugs Their current medications and supplements The patient's functional ability including ADLs,fall risks, home safety risks, cognitive, and hearing and visual impairment Diet and physical activities Evidence for depression or mood disorders  The patient's weight, height, BMI, and visual acuity have been recorded in the chart.  I have made referrals, counseling, and provided education to the patient based on review of the above and I have provided the patient with a written personalized care plan for preventive services.    RX for Clobex cream sent in- to apply bid x 2 weeks, then qhs x 2 weeks, then prn.  [place], [place]   11/22/2014

## 2014-11-23 ENCOUNTER — Encounter: Payer: Self-pay | Admitting: Podiatry

## 2014-11-24 ENCOUNTER — Ambulatory Visit (INDEPENDENT_AMBULATORY_CARE_PROVIDER_SITE_OTHER): Payer: Medicare Other | Admitting: Licensed Clinical Social Worker

## 2014-11-24 DIAGNOSIS — F411 Generalized anxiety disorder: Secondary | ICD-10-CM

## 2014-11-24 DIAGNOSIS — F331 Major depressive disorder, recurrent, moderate: Secondary | ICD-10-CM | POA: Diagnosis not present

## 2014-11-24 NOTE — Progress Notes (Signed)
THERAPIST PROGRESS NOTE  Session Time: 2:03 p.m. - 3: 30 p.m.  Participation Level: Active  Behavioral Response: NeatAlertDepressed and Hopeless  Type of Therapy: Individual Therapy  Treatment Goals addressed: Anger, Anxiety and Coping  Interventions: CBT, Solution Focused, Strength-based, Supportive and Reframing  Summary: Christine Jensen is a 64 y.o. female who presents with moderate depressive and anxious symptoms, further intensified by care-giving role for spouse who is a first responder during the attacks of 911 and with severe PTSD and cognitive deficits and also guides their adult son who has Autism.  Son has had several "melt downs" as per client that have required her to take time to calm him and assist him to understand.  She and spouse with episodic conflicts and she reflected on who her husband was prior to the 911 events and that "I've been covering up how he really is for years. Somehow I thought by telling people that he had problems that it would be easier but it isn't."  Christine Jensen has been addressing various medical conditions with her back and bowels. Recently experienced negative side effects from high dose of Gabapentin prescribed by Neurologist.  This is resolved after she stopped the dose.    Client reflected on her parent's high and likely unrealistic expectations of her as a young child and young woman growing up in [place].  Descriptions of parents indicated that both had some type of anxiety disorder and with hoarding tendencies and client stated "So I grew up in this."  She has been writing down stories of her life and shared that the only time she recalls actually doing things for herself was a span of time in the mid 1970's-early 1980's after ending her first marriage and coming out of a severe depression.  Christine Jensen shared how she spent time going to concerts and dancing in bars in [place] and enjoying time with friends.  Her current experience is that she is doing for her son  and husband and not for herself.  She was tearful on and off in session yet remains opposed to LCSW's recommendation to return to clinic to see on of our MDs for medication evaluation.  She is managing household tasks and there are underlying themes of anger, disappointment and hopelessness voiced.  Questioned LCSW stating "I don't know if I am a people helper or a people pleaser."  She concluded that she is more of a people helper since she does not need other people's approval to feel good about herself.  Suicidal/Homicidal: Negativewithout intent/plan  Therapist Response:   LCSW encouraged sharing feelings of depression, anger and anxiety in order to clarify them and gain insight as to causes. Gently discussed nature vs nurture aspects of how a person learns to tolerate or not tolerate negative emotions. Ongoing supportive counseling with insight about the grieving journey of watching a loved one decline. Provided Christine Jensen with several handouts of resources/readings and web-sites for families of people with mental illness and PTSD along with contact information for at least one care-giver support group.  Supportive therapy with insight and focused on client's strengths and resourcefulness while also validating the emotional, social and physical impact the care-giver role is having on her. Read with client information on people pleasing vs people helper characteristics and tips for setting healthier boundaries and self care.  Recommended client re-evaluate what choices she does have in her reactions to a life situation that she can influence to some degree, emphasized taking time for herself and use of opposite  behavior, i.e. Not challenging or trying to explain things to her husband which tends to result in her feeling more hopeless and angry.   Assisted client to begin to increase insight and identification into patterns of certain styles of thinking and behaviors and the resulting social, emotional and  behavioral impact on her.    Plan: Return again in two weeks.  Christine Jensen is aware that she can call between sessions PRN.  She will keep all medical appointments and take medications as prescribed.  LCSW will attempt to focus client on specific areas for change to enhance quality of life.  Diagnosis: Major Depressive Disorder, Recurrent, Moderate   Generalized Anxiety Disorder    Miguel Dibble, LCSW 11/24/2014

## 2014-12-08 ENCOUNTER — Encounter: Payer: Self-pay | Admitting: Family Medicine

## 2014-12-08 ENCOUNTER — Other Ambulatory Visit: Payer: Self-pay

## 2014-12-08 ENCOUNTER — Ambulatory Visit (INDEPENDENT_AMBULATORY_CARE_PROVIDER_SITE_OTHER): Payer: Medicare Other | Admitting: Family Medicine

## 2014-12-08 VITALS — BP 124/80 | HR 101 | Temp 98.3°F | Resp 16 | Ht 66.0 in | Wt 209.7 lb

## 2014-12-08 DIAGNOSIS — G8929 Other chronic pain: Secondary | ICD-10-CM | POA: Diagnosis not present

## 2014-12-08 DIAGNOSIS — E785 Hyperlipidemia, unspecified: Secondary | ICD-10-CM

## 2014-12-08 DIAGNOSIS — E039 Hypothyroidism, unspecified: Secondary | ICD-10-CM

## 2014-12-08 DIAGNOSIS — M2662 Arthralgia of temporomandibular joint: Secondary | ICD-10-CM | POA: Diagnosis not present

## 2014-12-08 DIAGNOSIS — G4700 Insomnia, unspecified: Secondary | ICD-10-CM

## 2014-12-08 DIAGNOSIS — E119 Type 2 diabetes mellitus without complications: Secondary | ICD-10-CM

## 2014-12-08 DIAGNOSIS — F419 Anxiety disorder, unspecified: Secondary | ICD-10-CM

## 2014-12-08 DIAGNOSIS — M26629 Arthralgia of temporomandibular joint, unspecified side: Secondary | ICD-10-CM

## 2014-12-08 DIAGNOSIS — M549 Dorsalgia, unspecified: Secondary | ICD-10-CM

## 2014-12-08 DIAGNOSIS — M545 Low back pain: Secondary | ICD-10-CM

## 2014-12-08 DIAGNOSIS — I10 Essential (primary) hypertension: Secondary | ICD-10-CM

## 2014-12-08 DIAGNOSIS — D692 Other nonthrombocytopenic purpura: Secondary | ICD-10-CM | POA: Diagnosis not present

## 2014-12-08 DIAGNOSIS — K589 Irritable bowel syndrome without diarrhea: Secondary | ICD-10-CM | POA: Insufficient documentation

## 2014-12-08 MED ORDER — LEVOTHYROXINE SODIUM 112 MCG PO TABS: 112.0000 ug | ORAL_TABLET | Freq: Every day | ORAL | Status: DC

## 2014-12-08 MED ORDER — ACETAMINOPHEN 500 MG PO TABS: 500.0000 mg | ORAL_TABLET | Freq: Four times a day (QID) | ORAL | Status: DC | PRN

## 2014-12-08 MED ORDER — DILTIAZEM HCL ER 180 MG PO CP24: 180.0000 mg | ORAL_CAPSULE | Freq: Every day | ORAL | Status: DC

## 2014-12-08 MED ORDER — ZOLPIDEM TARTRATE 10 MG PO TABS: 10.0000 mg | ORAL_TABLET | Freq: Every day | ORAL | Status: DC

## 2014-12-08 MED ORDER — PRAVASTATIN SODIUM 40 MG PO TABS: 40.0000 mg | ORAL_TABLET | Freq: Every day | ORAL | Status: DC

## 2014-12-08 MED ORDER — GLYBURIDE-METFORMIN 2.5-500 MG PO TABS
1.0000 | ORAL_TABLET | Freq: Two times a day (BID) | ORAL | Status: DC
Start: 2014-12-08 — End: 2015-07-13

## 2014-12-08 MED ORDER — LOSARTAN POTASSIUM-HCTZ 50-12.5 MG PO TABS
1.0000 | ORAL_TABLET | Freq: Every day | ORAL | Status: DC
Start: 2014-12-08 — End: 2015-11-13

## 2014-12-08 MED ORDER — CLONAZEPAM 0.5 MG PO TABS: 0.5000 mg | ORAL_TABLET | Freq: Every day | ORAL | Status: DC

## 2014-12-08 MED ORDER — CYCLOBENZAPRINE HCL 10 MG PO TABS: 10.0000 mg | ORAL_TABLET | ORAL | Status: DC | PRN

## 2014-12-08 NOTE — Patient Instructions (Signed)
Temporomandibular Problems  Temporomandibular joint (TMJ) dysfunction means there are problems with the joint between your jaw and your skull. This is a joint lined by cartilage like other joints in your body but also has a small disc in the joint which keeps the bones from rubbing on each other. These joints are like other joints and can get inflamed (sore) from arthritis and other problems. When this joint gets sore, it can cause headaches and pain in the jaw and the face. CAUSES  Usually the arthritic types of problems are caused by soreness in the joint. Soreness in the joint can also be caused by overuse. This may come from grinding your teeth. It may also come from mis-alignment in the joint. DIAGNOSIS Diagnosis of this condition can often be made by history and exam. Sometimes your caregiver may need X-rays or an MRI scan to determine the exact cause. It may be necessary to see your dentist to determine if your teeth and jaws are lined up correctly. TREATMENT  Most of the time this problem is not serious; however, sometimes it can persist (become chronic). When this happens medications that will cut down on inflammation (soreness) help. Sometimes a shot of cortisone into the joint will be helpful. If your teeth are not aligned it may help for your dentist to make a splint for your mouth that can help this problem. If no physical problems can be found, the problem may come from tension. If tension is found to be the cause, biofeedback or relaxation techniques may be helpful. HOME CARE INSTRUCTIONS   Later in the day, applications of ice packs may be helpful. Ice can be used in a plastic bag with a towel around it to prevent frostbite to skin. This may be used about every 2 hours for 20 to 30 minutes, as needed while awake, or as directed by your caregiver.  Only take over-the-counter or prescription medicines for pain, discomfort, or fever as directed by your caregiver.  If physical therapy was  prescribed, follow your caregiver's directions.  Wear mouth appliances as directed if they were given. Document Released: 02/04/2001 Document Revised: 08/04/2011 Document Reviewed: 05/14/2008 Exit[employer] Patient Information 2015 [employer]. This information is not intended to replace advice given to you by your health care provider. Make sure you discuss any questions you have with your health care provider.  

## 2014-12-08 NOTE — Progress Notes (Signed)
Name: Christine Jensen   MRN: [id number]    DOB: 11-14-1950   Date:12/08/2014       Progress Note  Subjective  Chief Complaint  Chief Complaint  Patient presents with  . Ear Pain    left onset 2 weeks radiates into jaw    HPI  TMJ pain: she developed left ear pain about two weeks ago, after a few days she felt pain on the TMJ area, causing difficulty chewing and has changed to soft food.  She said that is was locking her jaw, but not as severe today. She would like to make sure she does not have an ear infection. No fever, no nausea, no vomiting, no diaphoresis or chest pain.  Chronic back pain: seen by Dr. Arnoldo Morale, has a cyst near spine but not changing much and is taking pain medication prn and gabapentin, she was taking 200 mg daily and he switched to 600 mg three times daily, and after the first dose of medication  She developed left arm weakness and inability to move, she felt like she was having hallucinations/not sure if she was dreaming or awake, when she woke up in am left hand was swollen but resolved by itself. She stopped gabapentin, Advised to resume low dose of gabapentin and titrate up slowly, advised to call 911 if symptoms of weakness or numbness occurs again since it may be a TIA/CVA   Patient Active Problem List   Diagnosis Date Noted  . IBS (irritable bowel syndrome) 12/08/2014  . Insomnia, persistent 12/08/2014  . Depression, major, recurrent, moderate 11/24/2014  . Generalized anxiety disorder 11/24/2014  . Diabetes 09/15/2014  . Hypercholesteremia 09/15/2014  . Hypothyroid 09/15/2014    History  Substance Use Topics  . Smoking status: Never Smoker   . Smokeless tobacco: Never Used  . Alcohol Use: 0.0 oz/week    0 Standard drinks or equivalent per week     Comment: occas     Current outpatient prescriptions:  .  aspirin 81 MG tablet, Take 1 tablet by mouth daily., Disp: , Rfl:  .  clonazePAM (KLONOPIN) 0.5 MG tablet, Take 0.5 mg by mouth at bedtime., Disp:  , Rfl:  .  cyclobenzaprine (FLEXERIL) 10 MG tablet, Take 1 tablet by mouth as needed., Disp: , Rfl: 0 .  dicyclomine (BENTYL) 20 MG tablet, Take 20 mg by mouth 3 (three) times daily., Disp: , Rfl: 4 .  diltiazem (DILACOR XR) 180 MG 24 hr capsule, Take 180 mg by mouth daily., Disp: , Rfl:  .  gabapentin (NEURONTIN) 100 MG capsule, Take 100 mg by mouth 3 (three) times daily., Disp: , Rfl:  .  glyBURIDE-metformin (GLUCOVANCE) 2.5-500 MG per tablet, Take 1 tablet by mouth daily with breakfast., Disp: , Rfl:  .  HYDROcodone-acetaminophen (NORCO/VICODIN) 5-325 MG per tablet, Take 1 tablet by mouth as needed., Disp: , Rfl: 0 .  JUBLIA 10 % SOLN, , Disp: , Rfl:  .  levothyroxine (SYNTHROID, LEVOTHROID) 112 MCG tablet, Take 112 mcg by mouth daily before breakfast., Disp: , Rfl:  .  lidocaine (LIDODERM) 5 %, , Disp: , Rfl: 2 .  losartan-hydrochlorothiazide (HYZAAR) 50-12.5 MG per tablet, , Disp: , Rfl:  .  Magnesium Gluconate 250 MG TABS, Take 1 tablet by mouth daily., Disp: , Rfl:  .  Melatonin 10 MG CAPS, Take 1 tablet by mouth daily., Disp: , Rfl:  .  neomycin-polymyxin-hydrocortisone (CORTISPORIN) otic solution, 1-2 drops to the toe after soaking twice daily, Disp: 10 mL, Rfl: 1 .  pravastatin (PRAVACHOL) 40 MG tablet, Take 1 tablet by mouth daily., Disp: , Rfl:  .  pyrithione zinc (HEAD AND SHOULDERS) 1 % shampoo, Apply topically daily as needed for itching., Disp: , Rfl:  .  SYNTHROID 112 MCG tablet, , Disp: , Rfl:  .  terbinafine (LAMISIL) 250 MG tablet, Take 1 tablet (250 mg total) by mouth daily., Disp: 30 tablet, Rfl: 0 .  zolpidem (AMBIEN) 10 MG tablet, Take 10 mg by mouth at bedtime as needed for sleep., Disp: , Rfl:   Allergies  Allergen Reactions  . Erythromycin Shortness Of Breath  . Macadamia Nut Oil Shortness Of Breath    Oily nuts   . Codeine   . Penicillins   . Sulfa Antibiotics Other (See Comments)    Joints become painful   . Other Rash    Shoes     ROS  Ten systems  reviewed and is negative except as mentioned in HPI   Objective  Filed Vitals:   12/08/14 1531  BP: 124/80  Pulse: 101  Temp: 98.3 F (36.8 C)  TempSrc: Oral  Resp: 16  Height: 5\' 6"  (1.676 m)  Weight: 209 lb 11.2 oz (95.119 kg)  SpO2: 97%    Body mass index is 33.86 kg/(m^2).    Physical Exam   Constitutional: Patient appears well-developed and well-nourished. Obese Yes No distress.  Eyes:  No scleral icterus. PERL Neck: Normal range of motion. Neck supple. Oral exam: poor dentition but no abscess, pain during abduction of jaw, tender to touch on left TMJ area, no redness, rashes or swelling Cardiovascular: Normal rate, regular rhythm and normal heart sounds.  No murmur heard. No BLE edema. Pulmonary/Chest: Effort normal and breath sounds normal. No respiratory distress. Abdominal: Soft.  There is no tenderness. Psychiatric: Patient has a normal mood and affect. behavior is normal. Judgment and thought content normal. Skin: ecchymosis on both upper extremity, thin skin, fragile Neuro: normal exam    Assessment & Plan  1. TMJ tenderness Advised Tylenol three times daily  May use ice locally Avoid chewing  Discuss with dentist about mouth guard piece - acetaminophen (TYLENOL) 500 MG tablet; Take 1 tablet (500 mg total) by mouth every 6 (six) hours as needed.  Dispense: 30 tablet; Refill: 0  2. Senile purpura Noticed during exam  3. Chronic back pain Resume gabapentin but titrate slowly increase by 100 mg every other day

## 2014-12-28 ENCOUNTER — Other Ambulatory Visit: Payer: Self-pay | Admitting: Family Medicine

## 2014-12-28 NOTE — Telephone Encounter (Signed)
Patient requesting refill. 

## 2015-01-11 ENCOUNTER — Ambulatory Visit: Payer: Self-pay | Admitting: Licensed Clinical Social Worker

## 2015-01-12 ENCOUNTER — Ambulatory Visit (INDEPENDENT_AMBULATORY_CARE_PROVIDER_SITE_OTHER): Payer: Medicare Other | Admitting: Licensed Clinical Social Worker

## 2015-01-12 DIAGNOSIS — F331 Major depressive disorder, recurrent, moderate: Secondary | ICD-10-CM

## 2015-01-12 DIAGNOSIS — F411 Generalized anxiety disorder: Secondary | ICD-10-CM

## 2015-01-12 NOTE — Progress Notes (Signed)
THERAPIST PROGRESS NOTE  Session Time:  1:50 p.m.-  3:00 p.m.  Participation Level: Active  Behavioral Response: NeatAlertAngry, Anxious and Depressed  Type of Therapy: Individual Therapy  Treatment Goals addressed: Anger, Anxiety, Communication: with spouse to minimize instances of verbal confrontation between the two of them and Coping  Interventions: CBT, Motivational Interviewing, Solution Focused, Strength-based, Supportive and Reframing  Summary: Christine Jensen is a 64 y.o. female who [place] to OPT to address multiple life stressors.  She is still not taking any psychotropic meds and even with LCSW ongoing suggestion to at least try something for sleep, she continues to decline.  [name] is casually groomed, tearful on and off throughout session and with awareness that she has hypersensitivity and a view of situations that tends to be negative.  Client admits this is her thinking style since her experience has been that most of what she thinks is going to happen does happen.  Christine Jensen talked about "I didn't have the knot in my stomach." She was referring to how she felt when back home in [place].  They visited with and dined out with friends.  The sale of the home fell through.  Upon returning to the family home in [place], [place], She expressed: "There's no joy." Continues to struggle emotionally and socially with living in this area.  On way back to [place] from [place], she and spouse visited places in [place] that they liked but cannot afford to move now since she has not sold the home in [place].  "I can't manage having a third home."    Ongoing stress in care-giving relationship and grief associated with husband's cognitive and memory deficits were discussed. Her health problems are stable with the exception of sleep.  Recent visit with her PCP who is urging client to learn how to decrease her stress.  Christine Jensen does not feel optimistic about this and does not appear to apply the  information from hand-outs provided to her on stress management.  Newest stress is that the [facility] president is one of the tenants in her neighborhood that caused a lot of problems for Christine Jensen and her family.  Her fear is that the problems will start up again and go as far as before when there was a threat to put a lien on their home.  On a positive note, her son is doing fairly well in school and should graduate next Summer 2017.  During session she received a call from him about a school matter which she remained calm about.  Much venting and little receptiveness to believing or embracing that she can make changes in her attitude and approach in effort to decrease stress level. She admits that taking spouse different places is to entertain him and she denied that she experiences any interests in these outings.  Frustration voiced that client's therapist has not responded to her calls.  Other losses were discussed, primarily the loss of the man who she married who is very different due to his cognitive and psychiatric issues.  [name] indicated that coming to therapy gives her an out and that therapist does offer her different ways to look at situations.  No specific goals per client.   Suicidal/Homicidal: Negativewithout intent/plan  Therapist Response:   LCSW offered client emotional and social support along with gentle reinforcement about spouse's illness with emphasis on gaining her own set of coping skills and communication styles to redirect spouse without her becoming emotionally over-whelmed.  Recommended client take time  for herself and use of opposite behavior, i.e. Not challenging or trying to explain things to her husband which tends to result in her feeling more hopeless and angry.   Assisted client to begin to increase insight and identification into patterns of certain styles of thinking and behaviors and the resulting social, emotional and behavioral impact on her.  Validated her feelings as a  part of multiple life challenges and stressors.  Educated that if medications could improve her sleep and mood then it is likely she will have a healthier view of her own capacity to deal with and move through the losses resulting from the move to [facility] and spouse's illness.  Plan: Return again in two weeks.  [name] is aware that she can call between sessions PRN.  She will keep all medical appointments and take medications as prescribed.  LCSW will attempt to focus client on specific areas for change to enhance quality of life and more effective coping.  Diagnosis: Major Depressive Disorder, Recurrent, Moderate   Generalized Anxiety Disorder   Miguel Dibble, LCSW 01/12/2015

## 2015-01-15 DIAGNOSIS — Z23 Encounter for immunization: Secondary | ICD-10-CM | POA: Diagnosis not present

## 2015-01-25 ENCOUNTER — Ambulatory Visit (INDEPENDENT_AMBULATORY_CARE_PROVIDER_SITE_OTHER): Payer: Medicare Other | Admitting: Licensed Clinical Social Worker

## 2015-01-25 DIAGNOSIS — F411 Generalized anxiety disorder: Secondary | ICD-10-CM | POA: Diagnosis not present

## 2015-01-25 DIAGNOSIS — F331 Major depressive disorder, recurrent, moderate: Secondary | ICD-10-CM | POA: Diagnosis not present

## 2015-01-25 NOTE — Progress Notes (Signed)
THERAPIST PROGRESS NOTE  Session Time: 3:10 p.m. -  4:15 p.m.  Participation Level: Active  Behavioral Response: NeatAlertAnxious, Depressed, Hopeless and Irritable  Type of Therapy: Individual Therapy  Treatment Goals addressed: Anxiety, Communication: finding new ways to communicate with spouse who has cognitive deficits and short term memory loss and Coping  Interventions: Solution Focused, Strength-based, Supportive and Reframing  Summary: Christine Jensen is a 64 y.o. female who returns to OPT with ongoing depressive symptoms which she is now more accepting of and acknowledging. Primary symptoms include ongoing insomnia even with use of Ambien;  loss of interests, anger, crying spells, low energy and general sadness and hopelessness. "Things were so bad that I almost took the medication."  She was referring to the Xanax that her PCP has prescribed.  She is still not on an anti-depressant.  Anxiety symptoms are present as well but no indication of panics.  Session focused on ongoing care-giving stressors and sadness/grief associated with loss of spouse's functional capacity. "He's not my husband. It's like living with and taking care of a brother."  Described the last week as not good for her as result of recently published article out of a research hospital in [facility] that has found that the first responders during 911 have such a severe form of PTSD that it is causing executive functioning problems, other cognitive deficits as well as dementia.  "This just made it real. I guess I held out some hope but now there is no way to deny this."  She has shared this information with friends and family and discussed the findings with her husband.  "He just can't process it."  Additional stressor continues to be feeling uncertain about what action and/or social complications her neighborhood watch is going to cause.  A neighbor who is now friends with the [facility] President has encouraged client and her family to  sell and get out as soon as they can.  "Now I have to worry about what attacks they are going to take at [place]."  Yanitza agreed with several of LCSW's observations around use of similar strategies while raising their son with Autism with her husband could improve their communication.  "This is by far the worst thing that I've had to deal with."  Appropriate stress around not having help and/or someone that can distract her spouse enough or on a regular basis so that she can have some down time.  She reflected on what a good help and supportive spouse/father [name] was when they were raising their son.  "I don't have that now."  She remains socially isolated and indicates that she only feels better after "Coming to see you and dump this on you."  "There's nothing here."  The hope is that following their son's May 2017 college graduation that the family can move to [place] maybe [place] or [place] and can be closer to family and away from the [place] where the family has suffered many injustices.  Mandee sees her PCP in two weeks and agreed to talk to her about medications for mood, anxiety and sleep. She voiced awareness that she can also be seen in this clinic by a provider for medication management.  Suicidal/Homicidal: Negativewithout intent/plan  Therapist Response:   Supportive psychotherapy with emphasis on self care, medication evaluation, impact of grief and loss associated with finality of spouse's condition/symptoms and validated client's feelings. LCSW offered client ongoing emotional and social support along with gentle reinforcement about spouse's illness with emphasis on  gaining her own set of coping skills and communication styles to redirect spouse without her becoming emotionally over-whelmed.  Recommended client take time for herself and use of opposite behavior, i.e. Not challenging or trying to explain things to her husband which tends to result in her feeling more hopeless  and angry.   Assisted client to begin to increase insight and identification into patterns of certain styles of thinking and behaviors and the resulting social, emotional and behavioral impact on her.  Validated her feelings as a part of multiple life challenges and stressors.  Offered Beatrix a positive story by Mr. Perlman a famous violonist with emphasis with client on transcending her losses and be with spouse in a way that embraces how he is now and to allow the therapeutic process to assist her to focus on finding new ways to interact and communicate and be with her husband and let go of and accept things as they are in effort to find peace.  Plan: Return again in two weeks.  Adelyn is aware that she can call between sessions PRN.  She will keep all medical appointments and take medications as prescribed.  LCSW will continue to explore additional coping skills to focus client on specific areas for change to enhance quality of life and more effective coping.  Diagnosis: Major Depressive Disorder, Recurrent, Moderate   Generalized Anxiety Disorder    Miguel Dibble, LCSW 01/25/2015

## 2015-02-06 DIAGNOSIS — F5101 Primary insomnia: Secondary | ICD-10-CM | POA: Diagnosis not present

## 2015-02-06 DIAGNOSIS — E049 Nontoxic goiter, unspecified: Secondary | ICD-10-CM | POA: Diagnosis not present

## 2015-02-06 DIAGNOSIS — E119 Type 2 diabetes mellitus without complications: Secondary | ICD-10-CM | POA: Diagnosis not present

## 2015-02-06 DIAGNOSIS — E890 Postprocedural hypothyroidism: Secondary | ICD-10-CM | POA: Diagnosis not present

## 2015-02-06 DIAGNOSIS — E785 Hyperlipidemia, unspecified: Secondary | ICD-10-CM | POA: Diagnosis not present

## 2015-02-06 DIAGNOSIS — E039 Hypothyroidism, unspecified: Secondary | ICD-10-CM | POA: Diagnosis not present

## 2015-02-06 DIAGNOSIS — K229 Disease of esophagus, unspecified: Secondary | ICD-10-CM | POA: Diagnosis not present

## 2015-02-06 DIAGNOSIS — I10 Essential (primary) hypertension: Secondary | ICD-10-CM | POA: Diagnosis not present

## 2015-02-08 ENCOUNTER — Ambulatory Visit: Payer: Self-pay | Admitting: Licensed Clinical Social Worker

## 2015-02-09 ENCOUNTER — Other Ambulatory Visit: Payer: Self-pay

## 2015-02-09 ENCOUNTER — Ambulatory Visit
Admission: RE | Admit: 2015-02-09 | Discharge: 2015-02-09 | Disposition: A | Discharge status: Home / Self Care | Payer: Medicare Other | Source: Ambulatory Visit | Attending: Family Medicine | Admitting: Family Medicine

## 2015-02-09 ENCOUNTER — Encounter: Payer: Self-pay | Admitting: Family Medicine

## 2015-02-09 ENCOUNTER — Ambulatory Visit (INDEPENDENT_AMBULATORY_CARE_PROVIDER_SITE_OTHER): Payer: Medicare Other | Admitting: Family Medicine

## 2015-02-09 VITALS — BP 124/68 | HR 86 | Temp 97.5°F | Resp 16 | Ht 66.0 in | Wt 207.1 lb

## 2015-02-09 DIAGNOSIS — E119 Type 2 diabetes mellitus without complications: Secondary | ICD-10-CM | POA: Diagnosis not present

## 2015-02-09 DIAGNOSIS — R29818 Other symptoms and signs involving the nervous system: Secondary | ICD-10-CM | POA: Diagnosis not present

## 2015-02-09 DIAGNOSIS — Z23 Encounter for immunization: Secondary | ICD-10-CM | POA: Diagnosis not present

## 2015-02-09 DIAGNOSIS — B0229 Other postherpetic nervous system involvement: Secondary | ICD-10-CM

## 2015-02-09 DIAGNOSIS — I10 Essential (primary) hypertension: Secondary | ICD-10-CM

## 2015-02-09 DIAGNOSIS — R6884 Jaw pain: Secondary | ICD-10-CM | POA: Insufficient documentation

## 2015-02-09 DIAGNOSIS — G8929 Other chronic pain: Secondary | ICD-10-CM | POA: Diagnosis not present

## 2015-02-09 DIAGNOSIS — E7800 Pure hypercholesterolemia, unspecified: Secondary | ICD-10-CM

## 2015-02-09 DIAGNOSIS — G4700 Insomnia, unspecified: Secondary | ICD-10-CM | POA: Diagnosis not present

## 2015-02-09 DIAGNOSIS — R222 Localized swelling, mass and lump, trunk: Secondary | ICD-10-CM

## 2015-02-09 DIAGNOSIS — M549 Dorsalgia, unspecified: Secondary | ICD-10-CM | POA: Diagnosis not present

## 2015-02-09 DIAGNOSIS — R432 Parageusia: Secondary | ICD-10-CM | POA: Diagnosis not present

## 2015-02-09 DIAGNOSIS — H9202 Otalgia, left ear: Secondary | ICD-10-CM

## 2015-02-09 LAB — POCT UA - MICROALBUMIN: Microalbumin Ur, POC: NEGATIVE mg/L

## 2015-02-09 LAB — POCT GLYCOSYLATED HEMOGLOBIN (HGB A1C): HEMOGLOBIN A1C: 6.7

## 2015-02-09 MED ORDER — GABAPENTIN (ONCE-DAILY) 300 MG PO TABS: 0.5000 | ORAL_TABLET | Freq: Two times a day (BID) | ORAL | Status: DC

## 2015-02-09 MED ORDER — HYDROCODONE-ACETAMINOPHEN 5-325 MG PO TABS: 1.0000 | ORAL_TABLET | Freq: Four times a day (QID) | ORAL | Status: DC | PRN

## 2015-02-09 NOTE — Progress Notes (Signed)
Name: Christine Jensen   MRN: [id number]    DOB: 09/06/1950   Date:02/09/2015       Progress Note  Subjective  Chief Complaint  Chief Complaint  Patient presents with  . Medication Management    4 month F/U  . Diabetes    Checks BS twice daily, Low-70's High-230  . Insomnia    Unchanged, 6 hours nightly  . Hypertension  . Back Pain    Unchanged    HPI  Diabetes type II: she has been taking medication, denies polyphagia, polyuria or polydipsia. Average glucose in the pm is 150's -180's.  Only had one episode of hypoglycemia, down to 70 but did not have symptoms that day. She is taking sulfonylurea but states it is seldom when glucose drops. Explained risk of hypoglycemia , skip medication if you will skip a meal  Insomnia: sleeps 6 hours per night on medication  Hypertension: taking medication and denies side effects of medication. Denies chest pain.  Chronic back pain: she had shingles and diagnosed with post-herpetic neuralgia, but symptoms go worse and MRI of thoracic spine showed paraspinal growth. She has seen neurosurgeon, but mass has been stable in size since 2011. She would like a second opinion. She is taking Gabapentin 150mg  twice daily and symptoms are stable with episodes of significant pain.   Jaw  pain, change in taste sensation, fatigue: also has left ear pain, we will check c-reactive protein, going on for months.   Hyperlipidemia: taking Pravastatin and denies side effects of medication.   Patient Active Problem List   Diagnosis Date Noted  . Absence of sense of taste 02/09/2015  . Paraspinal mass 02/09/2015  . Hypertension, benign 02/09/2015  . Postherpetic neuralgia 02/09/2015  . IBS (irritable bowel syndrome) 12/08/2014  . Insomnia, persistent 12/08/2014  . Chronic back pain 12/08/2014  . Depression, major, recurrent, moderate 11/24/2014  . Generalized anxiety disorder 11/24/2014  . Diabetes 09/15/2014  . Hypercholesteremia 09/15/2014  . Hypothyroid  09/15/2014    Past Surgical History  Procedure Laterality Date  . Appendectomy    . Hernia repair    . Abdominal perineal bowel resection      Family History  Problem Relation Age of Onset  . Heart disease Mother   . Alzheimer's disease Mother   . Arthritis Father     Social History   Social History  . Marital Status: Married    Spouse Name: N/A  . Number of Children: N/A  . Years of Education: N/A   Occupational History  . Not on file.   Social History Main Topics  . Smoking status: Never Smoker   . Smokeless tobacco: Never Used  . Alcohol Use: 0.0 oz/week    0 Standard drinks or equivalent per week     Comment: rarely  . Drug Use: No  . Sexual Activity:    Partners: Male    Birth Control/ Protection: None   Other Topics Concern  . Not on file   Social History Narrative   ** Merged History Encounter **         Current outpatient prescriptions:  .  acetaminophen (TYLENOL) 500 MG tablet, Take 1 tablet (500 mg total) by mouth every 6 (six) hours as needed., Disp: 30 tablet, Rfl: 0 .  aspirin 81 MG tablet, Take 1 tablet by mouth daily., Disp: , Rfl:  .  cyclobenzaprine (FLEXERIL) 10 MG tablet, TAKE 1 TABLET BY MOUTH UP TO THREE TIMES DAILY FOR MUSCLE SPASMS, Disp: 90 tablet, Rfl:  0 .  dicyclomine (BENTYL) 20 MG tablet, Take 20 mg by mouth 3 (three) times daily., Disp: , Rfl: 4 .  diltiazem (DILACOR XR) 180 MG 24 hr capsule, Take 1 capsule (180 mg total) by mouth daily., Disp: 90 capsule, Rfl: 1 .  glyBURIDE-metformin (GLUCOVANCE) 2.5-500 MG per tablet, Take 1 tablet by mouth 2 (two) times daily., Disp: 180 tablet, Rfl: 1 .  HYDROcodone-acetaminophen (NORCO/VICODIN) 5-325 MG per tablet, Take 1 tablet by mouth every 6 (six) hours as needed for severe pain., Disp: 30 tablet, Rfl: 0 .  JUBLIA 10 % SOLN, , Disp: , Rfl:  .  levothyroxine (SYNTHROID, LEVOTHROID) 112 MCG tablet, Take 1 tablet (112 mcg total) by mouth daily before breakfast., Disp: 90 tablet, Rfl: 1 .   lidocaine (LIDODERM) 5 %, , Disp: , Rfl: 2 .  losartan-hydrochlorothiazide (HYZAAR) 50-12.5 MG per tablet, Take 1 tablet by mouth daily., Disp: 90 tablet, Rfl: 1 .  Magnesium Gluconate 250 MG TABS, Take 1 tablet by mouth daily., Disp: , Rfl:  .  Melatonin 10 MG CAPS, Take 1 tablet by mouth daily., Disp: , Rfl:  .  pravastatin (PRAVACHOL) 40 MG tablet, Take 1 tablet (40 mg total) by mouth daily., Disp: 90 tablet, Rfl: 1 .  pyrithione zinc (HEAD AND SHOULDERS) 1 % shampoo, Apply topically daily as needed for itching., Disp: , Rfl:  .  zolpidem (AMBIEN) 10 MG tablet, Take 1 tablet (10 mg total) by mouth at bedtime., Disp: 90 tablet, Rfl: 1 .  Gabapentin, Once-Daily, 300 MG TABS, Take 0.5 tablets by mouth 2 (two) times daily., Disp: 90 tablet, Rfl: 4  Allergies  Allergen Reactions  . Erythromycin Shortness Of Breath  . Macadamia Nut Oil Shortness Of Breath    Oily nuts   . Codeine   . Penicillins   . Sulfa Antibiotics Other (See Comments)    Joints become painful   . Other Rash    Shoes      ROS  Constitutional: Negative for fever or weight change.  Respiratory: Negative for cough and shortness of breath.   Cardiovascular: Negative for chest pain or palpitations.  Gastrointestinal: Negative for abdominal pain, no bowel changes.  Musculoskeletal: Negative for gait problem or joint swelling.  Skin: Negative for rash.  Neurological: Negative for dizziness or headache.  No other specific complaints in a complete review of systems (except as listed in HPI above).  Objective  Filed Vitals:   02/09/15 1337  BP: 124/68  Pulse: 86  Temp: 97.5 F (36.4 C)  TempSrc: Oral  Resp: 16  Height: 5\' 6"  (1.676 m)  Weight: 207 lb 1.6 oz (93.94 kg)  SpO2: 95%    Body mass index is 33.44 kg/(m^2).  Physical Exam  Constitutional: Patient appears well-developed and well-nourished. Obese No distress.  HEENT: head atraumatic, normocephalic, pupils equal and reactive to light, ears normal  exam, TM within normal limits neck supple, throat within normal limits, no tenderness during palpation of temporal area. Pain during palpation of left TMJ, pain when opens mouth Cardiovascular: Normal rate, regular rhythm and normal heart sounds.  No murmur heard. No BLE edema. Pulmonary/Chest: Effort normal and breath sounds normal. No respiratory distress. Abdominal: Soft.  There is no tenderness. Psychiatric: Patient is depressed, cried thinking about her husband. behavior is normal. Judgment and thought content normal. Muscular Skeletal: pain during palpation around T7-8 radiating to right paraspinal areas  Recent Results (from the past 2160 hour(s))  POCT HgB A1C     Status: Abnormal  Collection Time: 02/09/15  1:45 PM  Result Value Ref Range   Hemoglobin A1C 6.7     Diabetic Foot Exam: Diabetic Foot Exam - Simple   Simple Foot Form  Visual Inspection  No deformities, no ulcerations, no other skin breakdown bilaterally:  Yes  Sensation Testing  Intact to touch and monofilament testing bilaterally:  Yes  Pulse Check  Posterior Tibialis and Dorsalis pulse intact bilaterally:  Yes  Comments      PHQ2/9: Depression screen PHQ 2/9 12/08/2014  Decreased Interest 0  Down, Depressed, Hopeless 0  PHQ - 2 Score 0     Fall Risk: Fall Risk  02/09/2015 12/08/2014  Falls in the past year? No No     Functional Status Survey: Is the patient deaf or have difficulty hearing?: No Does the patient have difficulty seeing, even when wearing glasses/contacts?: Yes (glasses) Does the patient have difficulty concentrating, remembering, or making decisions?: No Does the patient have difficulty walking or climbing stairs?: No Does the patient have difficulty dressing or bathing?: No Does the patient have difficulty doing errands alone such as visiting a doctor's office or shopping?: No   Assessment & Plan  1. Diabetes type 2, controlled  Taking medication, discussed risk of  hypoglycemia - POCT HgB A1C - POCT UA - Microalbumin  2. Insomnia, persistent  Continue medication   3. Hypercholesteremia  - Lipid panel  4. Chronic back pain  - Gabapentin, Once-Daily, 300 MG TABS; Take 0.5 tablets by mouth 2 (two) times daily.  Dispense: 90 tablet; Refill: 4 - Ambulatory referral to Neurosurgery - HYDROcodone-acetaminophen (NORCO/VICODIN) 5-325 MG per tablet; Take 1 tablet by mouth every 6 (six) hours as needed for severe pain.  Dispense: 30 tablet; Refill: 0  5. Absence of sense of taste  - Ambulatory referral to ENT  7. Left ear pain  Refer to ENT, normal ear exam, a lot of pain during palpation of TMJ area  8. Paraspinal mass  - Gabapentin, Once-Daily, 300 MG TABS; Take 0.5 tablets by mouth 2 (two) times daily.  Dispense: 90 tablet; Refill: 4 - Ambulatory referral to Neurosurgery - HYDROcodone-acetaminophen (NORCO/VICODIN) 5-325 MG per tablet; Take 1 tablet by mouth every 6 (six) hours as needed for severe pain.  Dispense: 30 tablet; Refill: 0  9. Hypertension, benign  - Comprehensive metabolic panel  10. Postherpetic neuralgia  Continue gabapentin, not sure if pain is from paraspinal mass or from post-herpetic neuralgia at this poing  11. Jaw pain  Possible giant cell arteritis, discussed steroids, but she wants to have labs first, no eye symptoms, but has taste sensation changes - C-reactive protein -X-ray jaw

## 2015-02-13 DIAGNOSIS — E785 Hyperlipidemia, unspecified: Secondary | ICD-10-CM | POA: Diagnosis not present

## 2015-02-13 DIAGNOSIS — E049 Nontoxic goiter, unspecified: Secondary | ICD-10-CM | POA: Diagnosis not present

## 2015-02-13 DIAGNOSIS — E890 Postprocedural hypothyroidism: Secondary | ICD-10-CM | POA: Diagnosis not present

## 2015-02-13 DIAGNOSIS — K229 Disease of esophagus, unspecified: Secondary | ICD-10-CM | POA: Diagnosis not present

## 2015-02-13 DIAGNOSIS — F5101 Primary insomnia: Secondary | ICD-10-CM | POA: Diagnosis not present

## 2015-02-13 DIAGNOSIS — I10 Essential (primary) hypertension: Secondary | ICD-10-CM | POA: Diagnosis not present

## 2015-02-13 DIAGNOSIS — E119 Type 2 diabetes mellitus without complications: Secondary | ICD-10-CM | POA: Diagnosis not present

## 2015-02-14 ENCOUNTER — Telehealth: Payer: Self-pay | Admitting: Family Medicine

## 2015-02-14 DIAGNOSIS — G8929 Other chronic pain: Secondary | ICD-10-CM

## 2015-02-14 DIAGNOSIS — R222 Localized swelling, mass and lump, trunk: Secondary | ICD-10-CM

## 2015-02-14 DIAGNOSIS — M549 Dorsalgia, unspecified: Principal | ICD-10-CM

## 2015-02-14 NOTE — Telephone Encounter (Signed)
[facility] called needing clarification on patient's medication (HYROCODONE-ACETAMINOPHEN and GABAPENTIN).  One issue is they received and alert that patient has a codeine allergy and they are wanting to know if they need to go ahead and fill the prescription. The other issue is that the Gabapentin, it does not come in 0.5 doses, but it does come in extended release form.  Please call pharmacy and advise as to what they need to do.

## 2015-02-15 ENCOUNTER — Ambulatory Visit (INDEPENDENT_AMBULATORY_CARE_PROVIDER_SITE_OTHER): Payer: Medicare Other | Admitting: Licensed Clinical Social Worker

## 2015-02-15 DIAGNOSIS — F411 Generalized anxiety disorder: Secondary | ICD-10-CM

## 2015-02-15 DIAGNOSIS — F331 Major depressive disorder, recurrent, moderate: Secondary | ICD-10-CM | POA: Diagnosis not present

## 2015-02-15 MED ORDER — GABAPENTIN (ONCE-DAILY) 300 MG PO TABS: 1.0000 | ORAL_TABLET | Freq: Every evening | ORAL | Status: DC | PRN
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
## 2015-02-16 ENCOUNTER — Encounter: Payer: Self-pay | Admitting: Family Medicine

## 2015-02-19 ENCOUNTER — Telehealth: Payer: Self-pay | Admitting: Family Medicine

## 2015-02-19 DIAGNOSIS — M549 Dorsalgia, unspecified: Principal | ICD-10-CM

## 2015-02-19 DIAGNOSIS — R222 Localized swelling, mass and lump, trunk: Secondary | ICD-10-CM

## 2015-02-19 DIAGNOSIS — G8929 Other chronic pain: Secondary | ICD-10-CM

## 2015-02-19 MED ORDER — GABAPENTIN (ONCE-DAILY) 300 MG PO TABS: 1.0000 | ORAL_TABLET | Freq: Every day | ORAL | Status: DC

## 2015-02-19 NOTE — Telephone Encounter (Signed)
NEED CLARIFICATION: needing dosage, strength and quanitity instructions along with her DEA number Sunday Spillers from [place] Reference #: [id number]

## 2015-02-19 NOTE — Telephone Encounter (Signed)
Called and clarified medication, pharmacist states Gabapentin 300 mg only comes in capsules not tablets.

## 2015-02-21 DIAGNOSIS — R439 Unspecified disturbances of smell and taste: Secondary | ICD-10-CM | POA: Diagnosis not present

## 2015-02-21 DIAGNOSIS — M2660 Temporomandibular joint disorder, unspecified: Secondary | ICD-10-CM | POA: Diagnosis not present

## 2015-02-21 DIAGNOSIS — H60542 Acute eczematoid otitis externa, left ear: Secondary | ICD-10-CM | POA: Diagnosis not present

## 2015-02-21 DIAGNOSIS — H6121 Impacted cerumen, right ear: Secondary | ICD-10-CM | POA: Diagnosis not present

## 2015-03-01 ENCOUNTER — Ambulatory Visit (INDEPENDENT_AMBULATORY_CARE_PROVIDER_SITE_OTHER): Payer: Medicare Other | Admitting: Licensed Clinical Social Worker

## 2015-03-01 DIAGNOSIS — F331 Major depressive disorder, recurrent, moderate: Secondary | ICD-10-CM

## 2015-03-01 DIAGNOSIS — F411 Generalized anxiety disorder: Secondary | ICD-10-CM | POA: Diagnosis not present
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
## 2015-03-15 ENCOUNTER — Ambulatory Visit (INDEPENDENT_AMBULATORY_CARE_PROVIDER_SITE_OTHER): Payer: Medicare Other | Admitting: Licensed Clinical Social Worker

## 2015-03-15 DIAGNOSIS — F331 Major depressive disorder, recurrent, moderate: Secondary | ICD-10-CM

## 2015-03-15 DIAGNOSIS — F411 Generalized anxiety disorder: Secondary | ICD-10-CM | POA: Diagnosis not present

## 2015-03-15 NOTE — Progress Notes (Signed)
THERAPIST PROGRESS NOTE  Session Time:  2:15 p.m. - 3:30 p.m.  Participation Level: Active  Behavioral Response: CasualAlertAngry, Anxious and Depressed  Type of Therapy: Individual Therapy  Treatment Goals addressed: Anger and Coping  Interventions: CBT, Motivational Interviewing, Solution Focused, Strength-based, Supportive and Reframing  Summary: Mattea Seger is a 64 y.o. female who presents with ongoing difficulty adjusting to life here in [place] in spite of many efforts on her part to integrate herself.  Her feelings of hopelessness and helplessness revolve around appropriate grief as a result of spouse's PTSD and Cognitive deficits/executive functioning problems.  In terms of how she may be internalizing spouse's emotional and social reactions towards her and stated:"Maybe I'm reading more into it."  Milady was very tearful during session as she talked about the various losses both in moving to [place] and growing distance between she and spouse.  "I can't just leave him."  Some increase stress around upcoming visit with her sister-in-law who will visit in November but she also expressed "She'll keep him busy."  On a positive note Daizha continues to pursue activities outside of the home such as exercise class at [employer] and dinner weekly with a Meet Up group with people who are primarily from the [place] focused on exploration of various perspectives that client could use to decrease the intensity of the negative emotions that she experiences.  She has not re-started anti-depressant medication and continues to have insomnia, crying spells, irritability, anxiety, voiced hopelessness and helplessness.  She maintains her personal hygiene and completes household tasks and continues to engage in activities outside of the home yet informed LCSW that she only does these things to get her husband out of the house and maintains little interest in activities.  Client was  receptive to article and conversation about being a care-giver so someone with PTSD and cognitive deficits along with the common factors that negatively impact relationships when a partner is living with PTSD.  She could relate to several of these factors and also agreed to read over this article to begin thinking about different emotional and behavioral approaches when interacting with her husband.  Satcha voiced understanding of and awareness that next follow up with LCSW may be several weeks due to insurance credentialing.  She informed LCSW that she would prefer to continue OPT with LCSW at therapist's new location.  She voiced understanding of availability of other LCSW in this clinic and how to access additional support in the interim.  Suicidal/Homicidal: Negativewithout intent/plan  Therapist Response:   Supportive psychotherapy with insight was provided with ongoing emotional support and validation of client's feelings related to situational stressors and losses.  LCSW gently challenged client to consider practicing some of the tips recommend in the previous articles on care-giving and on the new article provided today specifically when living with a partner with PTSD.  Continued, with use of socratic questions to gently challenge client to begin to increase her insights and identification into patterns of certain styles of thinking and behaviors that are having a negative social, emotional and behavioral impact on her and the quality of her relationship with spouse.   Plan: Return again PRN. Alixis is aware that she can call between sessions PRN.  She will keep all medical appointments and take medications as prescribed.  Client will continue to see this LCSW in new outpatient clinic and access other support services in the interim.  Diagnosis: Major Depressive Disorder, Recurrent, Moderate  Generalized Anxiety Disorder  Miguel Dibble, LCSW 03/15/2015

## 2015-03-20 DIAGNOSIS — L538 Other specified erythematous conditions: Secondary | ICD-10-CM | POA: Diagnosis not present

## 2015-03-20 DIAGNOSIS — L408 Other psoriasis: Secondary | ICD-10-CM | POA: Diagnosis not present

## 2015-03-20 DIAGNOSIS — L400 Psoriasis vulgaris: Secondary | ICD-10-CM | POA: Diagnosis not present

## 2015-03-22 ENCOUNTER — Ambulatory Visit (INDEPENDENT_AMBULATORY_CARE_PROVIDER_SITE_OTHER): Payer: Medicare Other | Admitting: Licensed Clinical Social Worker

## 2015-03-22 DIAGNOSIS — F411 Generalized anxiety disorder: Secondary | ICD-10-CM

## 2015-03-22 DIAGNOSIS — F331 Major depressive disorder, recurrent, moderate: Secondary | ICD-10-CM

## 2015-03-22 NOTE — Progress Notes (Signed)
THERAPIST PROGRESS NOTE  Session Time: 1:10 p.m. - 2:20 p.m.  Participation Level: Active  Behavioral Response: CasualAlertAnxious, Depressed and Irritable  Type of Therapy: Individual Therapy  Treatment Goals addressed: Communication: effective strategies when living with a loved one with cognitive & executive functioning deficits and Coping  Interventions: CBT, Solution Focused, Supportive, Family Systems and Reframing  Summary: Christine Jensen is a 64 y.o. female who presents with ongoing symptoms of depression as evidenced by irritability, sadness, crying spells, loss of interest, insomnia, overall dissatisfaction with life.  These symptoms are further exacerbated by the stress of being socially isolated, away from friends/family, spouse's mental health/cognitive changes and financial strain due to managing a home her in [place] and one in [place].  "I still feel the resentment. What the articles don't say is how to get over the resentment."  Ongoing intensely negative emotions and a feeling of hopelessness and helplessness in light of above mentioned psychosocial stressors.  She does pursue activities outside of the home such as exercise class at [employer] and dinner weekly with a Meet Up group with people who are primarily from the [place] she will have a bit of a respite when her sister-in-law visits around Thanksgiving.  Session continued to focus on exploration of various perspectives that client could use to decrease the intensity of her negative emotions.  She has not re-started anti-depressant medication and has not requested an appointment in this office as of date of session.  She does follow up fairly regularly with her PCP, Dr. Ancil Boozer.    Client was receptive to article and conversation about being a care-giver so someone with PTSD and cognitive deficits along with the common factors that negatively impact relationships when a partner is living with PTSD.  She  could relate to several of these factors and also agreed to read over this article to begin thinking about different emotional and behavioral approaches when interacting with her husband.  Christine Jensen was also receptive to trying different behavioral modification and cognitive strategies that may decrease the tension between she and spouse's interactions.  As part of client learning how to accept spouse's behaviors towards her as not personal, we focused on re-framing several of her irrational beliefs about herself, her husband & his abilities vs. Deficits and expectations of how things "should" or "should not" be.  Christine Jensen voiced understanding of and awareness that next follow up with LCSW may be several weeks due to insurance credentialing.  She informed LCSW that she would prefer to continue OPT with LCSW at therapist's new location.  She voiced understanding of availability of other LCSW in this clinic and how to access additional support in the interim.  Suicidal/Homicidal: Negativewithout intent/plan  Therapist Response:   Supportive psychotherapy with insight was provided with ongoing emotional support and validation of client's feelings related to situational stressors and losses.  LCSW gently challenged client to consider practicing some of the tips recommend in the previous articles on care-giving and on the new article provided today specifically when living with a partner with PTSD.  Continued, with use of socratic questions to gently challenge client to begin to increase her insights and identification into patterns of certain styles of thinking and behaviors that are having a negative social, emotional and behavioral impact on her and the quality of her relationship with spouse. Assist to understand and use the REBT model to re-frame cognitive distortions that may contribute to her moods and symptoms.  Normalized her feelings of  resentment and disappointment and again encouraged client to consider  the information discussed previously on Radical Acceptance.   Plan: Return again PRN. Christine Jensen is aware that she can call between sessions PRN.  She will keep all medical appointments and take medications as prescribed.  Client will continue to see this LCSW in new outpatient clinic and access other support services in the interim.  Diagnosis: Major Depressive Disorder, Recurrent, Moderate   Generalized Anxiety Disorder  Miguel Dibble, LCSW 03/22/2015

## 2015-04-09 DIAGNOSIS — M26609 Unspecified temporomandibular joint disorder, unspecified side: Secondary | ICD-10-CM | POA: Diagnosis not present

## 2015-04-09 DIAGNOSIS — H60542 Acute eczematoid otitis externa, left ear: Secondary | ICD-10-CM | POA: Diagnosis not present

## 2015-04-12 ENCOUNTER — Encounter: Payer: Self-pay | Admitting: Family Medicine

## 2015-04-12 ENCOUNTER — Ambulatory Visit (INDEPENDENT_AMBULATORY_CARE_PROVIDER_SITE_OTHER): Payer: Medicare Other | Admitting: Family Medicine

## 2015-04-12 VITALS — BP 126/78 | HR 72 | Temp 98.3°F | Resp 18 | Ht 66.0 in | Wt 201.5 lb

## 2015-04-12 DIAGNOSIS — M7062 Trochanteric bursitis, left hip: Secondary | ICD-10-CM

## 2015-04-12 DIAGNOSIS — F331 Major depressive disorder, recurrent, moderate: Secondary | ICD-10-CM

## 2015-04-12 DIAGNOSIS — R432 Parageusia: Secondary | ICD-10-CM | POA: Diagnosis not present

## 2015-04-12 DIAGNOSIS — R6884 Jaw pain: Secondary | ICD-10-CM | POA: Diagnosis not present

## 2015-04-12 DIAGNOSIS — G4700 Insomnia, unspecified: Secondary | ICD-10-CM

## 2015-04-12 DIAGNOSIS — R222 Localized swelling, mass and lump, trunk: Secondary | ICD-10-CM

## 2015-04-12 DIAGNOSIS — R29818 Other symptoms and signs involving the nervous system: Secondary | ICD-10-CM

## 2015-04-12 MED ORDER — ZOLPIDEM TARTRATE 10 MG PO TABS
10.0000 mg | ORAL_TABLET | Freq: Every day | ORAL | Status: DC
Start: 2015-04-12 — End: 2015-07-13

## 2015-04-12 MED ORDER — LIDOCAINE HCL (PF) 1 % IJ SOLN
2.0000 mL | Freq: Once | INTRAMUSCULAR | Status: AC
  Administered 2015-04-12: 2 mL via INTRADERMAL

## 2015-04-12 MED ORDER — TRIAMCINOLONE ACETONIDE 40 MG/ML IJ SUSP
40.0000 mg | Freq: Once | INTRAMUSCULAR | Status: AC
  Administered 2015-04-12: 40 mg via INTRAMUSCULAR

## 2015-04-12 NOTE — Progress Notes (Signed)
Name: Christine Jensen   MRN: [id number]    DOB: [birth year]   Date:04/12/2015       Progress Note  Subjective  Chief Complaint  Chief Complaint  Patient presents with  . Follow-up  . Depression    unchanged good days and bad days  . Jaw Pain    unchanged still no taste    HPI  Major Depression: she is still very depressed, frustrated with husband with poor memory, she still sees therapist, but not on medication, she states she tried some in the past, but she states will not help because of her situation - having a husband with PTSD, and lack of executive function   Lack of taste: seen by ENT yesterday and told her likely from gabapentin.   Otitis Externa: seen by ENT, using ear drops and ear pain has improved.  Chronic back pain: she had shingles and diagnosed with post-herpetic neuralgia, but symptoms go worse and MRI of thoracic spine showed paraspinal growth. She has seen neurosurgeon, but mass has been stable in size since 2011. She would like a second opinion. She is taking Gabapentin 150mg  twice daily and symptoms are stable with episodes of significant pain. We made [facility] at [facility] but they advised her to see vascular surgeon, but the vascular surgeons stated not something they can help her with.   Jaw Pain: we discussed fibromyalgia rheumatica but she never had labs done, she states she has been so overwhelmed and forgot to do it, she will try this week  Left trochanteric bursitis:    Patient Active Problem List   Diagnosis Date Noted  . Absence of sense of taste 02/09/2015  . Paraspinal mass 02/09/2015  . Hypertension, benign 02/09/2015  . Postherpetic neuralgia 02/09/2015  . IBS (irritable bowel syndrome) 12/08/2014  . Insomnia, persistent 12/08/2014  . Chronic back pain 12/08/2014  . Depression, major, recurrent, moderate ([place]) 11/24/2014  . Generalized anxiety disorder 11/24/2014  . Diabetes ([place]) 09/15/2014  . Hypercholesteremia 09/15/2014  . Hypothyroid  09/15/2014    Past Surgical History  Procedure Laterality Date  . Appendectomy    . Hernia repair    . Abdominal perineal bowel resection      Family History  Problem Relation Age of Onset  . Heart disease Mother   . Alzheimer's disease Mother   . Arthritis Father     Social History   Social History  . Marital Status: Married    Spouse Name: N/A  . Number of Children: N/A  . Years of Education: N/A   Occupational History  . Not on file.   Social History Main Topics  . Smoking status: Never Smoker   . Smokeless tobacco: Never Used  . Alcohol Use: 0.0 oz/week    0 Standard drinks or equivalent per week     Comment: rarely  . Drug Use: No  . Sexual Activity:    Partners: Male    Birth Control/ Protection: None   Other Topics Concern  . Not on file   Social History Narrative   ** Merged History Encounter **         Current outpatient prescriptions:  .  acetaminophen (TYLENOL) 500 MG tablet, Take 1 tablet (500 mg total) by mouth every 6 (six) hours as needed., Disp: 30 tablet, Rfl: 0 .  aspirin 81 MG tablet, Take 1 tablet by mouth daily., Disp: , Rfl:  .  cyclobenzaprine (FLEXERIL) 10 MG tablet, TAKE 1 TABLET BY MOUTH UP TO THREE TIMES DAILY  FOR MUSCLE SPASMS, Disp: 90 tablet, Rfl: 0 .  dicyclomine (BENTYL) 20 MG tablet, Take 20 mg by mouth 3 (three) times daily., Disp: , Rfl: 4 .  diltiazem (DILACOR XR) 180 MG 24 hr capsule, Take 1 capsule (180 mg total) by mouth daily., Disp: 90 capsule, Rfl: 1 .  Gabapentin, Once-Daily, 300 MG TABS, Take 1 capsule by mouth at bedtime., Disp: 90 tablet, Rfl: 4 .  glyBURIDE-metformin (GLUCOVANCE) 2.5-500 MG per tablet, Take 1 tablet by mouth 2 (two) times daily., Disp: 180 tablet, Rfl: 1 .  HYDROcodone-acetaminophen (NORCO/VICODIN) 5-325 MG per tablet, Take 1 tablet by mouth every 6 (six) hours as needed for severe pain., Disp: 30 tablet, Rfl: 0 .  ibuprofen (ADVIL,MOTRIN) 800 MG tablet, Take 800 mg by mouth every 8 (eight)  hours., Disp: , Rfl: 6 .  JUBLIA 10 % SOLN, , Disp: , Rfl:  .  levothyroxine (SYNTHROID, LEVOTHROID) 112 MCG tablet, Take 1 tablet (112 mcg total) by mouth daily before breakfast., Disp: 90 tablet, Rfl: 1 .  lidocaine (LIDODERM) 5 %, , Disp: , Rfl: 2 .  losartan-hydrochlorothiazide (HYZAAR) 50-12.5 MG per tablet, Take 1 tablet by mouth daily., Disp: 90 tablet, Rfl: 1 .  Magnesium Gluconate 250 MG TABS, Take 1 tablet by mouth daily., Disp: , Rfl:  .  Melatonin 10 MG CAPS, Take 1 tablet by mouth daily., Disp: , Rfl:  .  mometasone (ELOCON) 0.1 % lotion, APPLY TO ITCHY EARS TWICE DAILY FOR 12 DAYS. MAY REPEAT AS NEEDED FOR ITCHY EARS, Disp: , Rfl: 10 .  pravastatin (PRAVACHOL) 40 MG tablet, Take 1 tablet (40 mg total) by mouth daily., Disp: 90 tablet, Rfl: 1 .  pyrithione zinc (HEAD AND SHOULDERS) 1 % shampoo, Apply topically daily as needed for itching., Disp: , Rfl:  .  zolpidem (AMBIEN) 10 MG tablet, Take 1 tablet (10 mg total) by mouth at bedtime., Disp: 90 tablet, Rfl: 1  Allergies  Allergen Reactions  . Erythromycin Shortness Of Breath  . Macadamia Nut Oil Shortness Of Breath    Oily nuts   . Codeine   . Penicillins   . Sulfa Antibiotics Other (See Comments)    Joints become painful   . Other Rash    Shoes      ROS  Constitutional: Negative for fever and positive for  weight change - lost 6 more pounds since last visit.  Respiratory: Negative for cough and shortness of breath.   Cardiovascular: Negative for chest pain or palpitations.  Gastrointestinal: Negative for abdominal pain, no bowel changes.  Musculoskeletal: Positive for gait problem or joint swelling.  Skin: Negative for rash.  Neurological: Negative for dizziness or headache.  No other specific complaints in a complete review of systems (except as listed in HPI above).  Objective  Filed Vitals:   04/12/15 1512  BP: 126/78  Pulse: 72  Temp: 98.3 F (36.8 C)  TempSrc: Oral  Resp: 18  Height: 5\' 6"  (1.676 m)   Weight: 201 lb 8 oz (91.4 kg)  SpO2: 94%    Body mass index is 32.54 kg/(m^2).  Physical Exam  Constitutional: Patient appears well-developed and well-nourished. Obese No distress.  HEENT: head atraumatic, normocephalic, pupils equal and reactive to light,neck supple, throat within normal limits, left jaw pain Cardiovascular: Normal rate, regular rhythm and normal heart sounds.  No murmur heard. No BLE edema. Pulmonary/Chest: Effort normal and breath sounds normal. No respiratory distress. Abdominal: Soft.  There is no tenderness. Psychiatric: Patient has a normal mood  and affect. behavior is normal. Judgment and thought content normal. Muscular Skeletal: antalgic gait, pain during palpation of left trochanteric bursa. Pain with internal and external rotation of left hip  Recent Results (from the past 2160 hour(s))  POCT HgB A1C     Status: Abnormal   Collection Time: 02/09/15  1:45 PM  Result Value Ref Range   Hemoglobin A1C 6.7   POCT UA - Microalbumin     Status: None   Collection Time: 02/09/15  2:45 PM  Result Value Ref Range   Microalbumin Ur, POC negative mg/L   Creatinine, POC  mg/dL   Albumin/Creatinine Ratio, Urine, POC       PHQ2/9: Depression screen [facility] 2/9 12/08/2014  Decreased Interest 0  Down, Depressed, Hopeless 0  PHQ - 2 Score 0     Fall Risk: Fall Risk  02/09/2015 12/08/2014  Falls in the past year? No No    Assessment & Plan   1. Paraspinal mass  - Ambulatory referral to Neurosurgery  2. Trochanteric bursitis of left hip  Consent form signed Localized trochanteric bursa left Area prepped with alcohol  Injection with lidocaine 1% and Kenalog 40mg /1 ml on bursa sack Patient tolerated procedure well No side effects   3. Depression, major, recurrent, moderate ([place])  She refuses medication, continue follow up with therapist  4. Absence of sense of taste  Seen by ENT likely from gabapentin  5 Jaw pain  Needs to have labs to make sure  that is not fibromyalgia rheumatica  6 Insomnia  - zolpidem (AMBIEN) 10 MG tablet; Take 1 tablet (10 mg total) by mouth at bedtime.  Dispense: 90 tablet; Refill: 1

## 2015-04-13 DIAGNOSIS — I10 Essential (primary) hypertension: Secondary | ICD-10-CM | POA: Diagnosis not present

## 2015-04-13 DIAGNOSIS — R6884 Jaw pain: Secondary | ICD-10-CM | POA: Diagnosis not present

## 2015-04-13 DIAGNOSIS — E7800 Pure hypercholesterolemia, unspecified: Secondary | ICD-10-CM | POA: Diagnosis not present

## 2015-04-14 LAB — COMPREHENSIVE METABOLIC PANEL
A/G RATIO: 1.3 (ref 1.1–2.5)
ALK PHOS: 78 IU/L (ref 39–117)
ALT: 13 IU/L (ref 0–32)
AST: 22 IU/L (ref 0–40)
Albumin: 4.1 g/dL (ref 3.6–4.8)
BILIRUBIN TOTAL: 0.4 mg/dL (ref 0.0–1.2)
BUN / CREAT RATIO: 17 (ref 11–26)
BUN: 17 mg/dL (ref 8–27)
CHLORIDE: 101 mmol/L (ref 97–106)
CO2: 27 mmol/L (ref 18–29)
Calcium: 10 mg/dL (ref 8.7–10.3)
Creatinine, Ser: 1.01 mg/dL — ABNORMAL HIGH (ref 0.57–1.00)
GFR calc Af Amer: 68 mL/min/{1.73_m2} (ref >59)
GFR calc non Af Amer: 59 mL/min/{1.73_m2} — ABNORMAL LOW (ref >59)
GLOBULIN, TOTAL: 3.1 g/dL (ref 1.5–4.5)
Glucose: 99 mg/dL (ref 65–99)
Potassium: 4.8 mmol/L (ref 3.5–5.2)
SODIUM: 142 mmol/L (ref 136–144)
TOTAL PROTEIN: 7.2 g/dL (ref 6.0–8.5)

## 2015-04-14 LAB — C-REACTIVE PROTEIN: CRP: 5.7 mg/L — AB (ref 0.0–4.9)

## 2015-04-14 LAB — LIPID PANEL
CHOLESTEROL TOTAL: 112 mg/dL (ref 100–199)
Chol/HDL Ratio: 2.8 ratio units (ref 0.0–4.4)
HDL: 40 mg/dL (ref >39)
LDL Calculated: 54 mg/dL (ref 0–99)
TRIGLYCERIDES: 89 mg/dL (ref 0–149)
VLDL CHOLESTEROL CAL: 18 mg/dL (ref 5–40)

## 2015-04-17 ENCOUNTER — Telehealth: Payer: Self-pay | Admitting: Family Medicine

## 2015-04-17 NOTE — Telephone Encounter (Signed)
Was seen Thursday and was given a shot for bursitis in the left leg and it did not help. Please advise.

## 2015-04-18 NOTE — Telephone Encounter (Signed)
She has two choices, call Ortho or I can start her on daily prednisone for possibly Polymyalgia rheumatica - she needs to know that her glucose will be very high while on steroids.

## 2015-04-18 NOTE — Telephone Encounter (Signed)
Patient notified of her two choices for her bursitis, and states since she is a diabetic she will think about taking prednisone due to it making her sugar run up. She will think about it over the holidays and call back Monday to let us know which one she like to do for her pain.

## 2015-04-24 ENCOUNTER — Telehealth: Payer: Self-pay

## 2015-04-24 ENCOUNTER — Telehealth: Payer: Self-pay | Admitting: Family Medicine

## 2015-04-24 ENCOUNTER — Other Ambulatory Visit: Payer: Self-pay | Admitting: Family Medicine

## 2015-04-24 DIAGNOSIS — R7982 Elevated C-reactive protein (CRP): Secondary | ICD-10-CM

## 2015-04-24 DIAGNOSIS — M2550 Pain in unspecified joint: Secondary | ICD-10-CM

## 2015-04-24 NOTE — Telephone Encounter (Signed)
Called patient and asked her to return my call in reference to her referrals. I have her NeuroSurgery appointment scheduled and would like to know where to send her Rheumatology referral.

## 2015-04-24 NOTE — Telephone Encounter (Signed)
Requesting a return call. She is having problems with her Zolpidem prescription. She is also requesting an afternoon appointment with an ortho at [facility]

## 2015-04-25 ENCOUNTER — Telehealth: Payer: Self-pay | Admitting: Family Medicine

## 2015-04-25 NOTE — Telephone Encounter (Signed)
Christine Jensen from [facility] wanted to inform you that the patient has already obtained her 90day supply of Zolpidem (genric for Ambien). But they wanted to inform you that each time a 90day prescription is written it will require a prior authorization. You can call the Prior Authorization Dept at [phone] and give a verbal or have the prior authorization dept fax the form and you resend it back it.

## 2015-04-25 NOTE — Telephone Encounter (Signed)
[facility] called and we did the authorization over the phone and it was approved.

## 2015-04-25 NOTE — Telephone Encounter (Signed)
Spoke with patient and informed her of the Neurosurgery appointment, and she stated she would like to go to [facility] for her Rheumatology referral.

## 2015-04-30 DIAGNOSIS — F331 Major depressive disorder, recurrent, moderate: Secondary | ICD-10-CM | POA: Diagnosis not present

## 2015-04-30 DIAGNOSIS — F411 Generalized anxiety disorder: Secondary | ICD-10-CM | POA: Diagnosis not present

## 2015-04-30 DIAGNOSIS — Z600 Problems of adjustment to life-cycle transitions: Secondary | ICD-10-CM | POA: Diagnosis not present

## 2015-05-10 ENCOUNTER — Telehealth: Payer: Self-pay | Admitting: Family Medicine

## 2015-05-10 NOTE — Telephone Encounter (Signed)
Patient was referred to [facility] rheumatologist. Patient feels that it is to far out and would like something a little closer to [address]. She would also like to know why is in not able to go back to [place] clinic?

## 2015-05-14 DIAGNOSIS — F411 Generalized anxiety disorder: Secondary | ICD-10-CM | POA: Diagnosis not present

## 2015-05-14 DIAGNOSIS — Z600 Problems of adjustment to life-cycle transitions: Secondary | ICD-10-CM | POA: Diagnosis not present

## 2015-05-14 DIAGNOSIS — F331 Major depressive disorder, recurrent, moderate: Secondary | ICD-10-CM | POA: Diagnosis not present

## 2015-05-29 DIAGNOSIS — Z600 Problems of adjustment to life-cycle transitions: Secondary | ICD-10-CM | POA: Diagnosis not present

## 2015-05-29 DIAGNOSIS — F331 Major depressive disorder, recurrent, moderate: Secondary | ICD-10-CM | POA: Diagnosis not present

## 2015-05-29 DIAGNOSIS — F411 Generalized anxiety disorder: Secondary | ICD-10-CM | POA: Diagnosis not present

## 2015-06-05 ENCOUNTER — Telehealth: Payer: Self-pay

## 2015-06-05 NOTE — Telephone Encounter (Signed)
Are you doing this Shelly? Thank you

## 2015-06-05 NOTE — Telephone Encounter (Signed)
Patient came in with a note stating that she has been trying to get to see a Rheumatologist since Thanksgiving but has not heard anything. A referral was placed on 04/24/15 to see Gavin Pound with [facility], but the attached paper was for [facility].  I called their ([facility]) office and they are willing to see her, but they just need the referral.

## 2015-06-06 ENCOUNTER — Telehealth: Payer: Self-pay

## 2015-06-06 DIAGNOSIS — M2550 Pain in unspecified joint: Secondary | ICD-10-CM

## 2015-06-06 DIAGNOSIS — R7982 Elevated C-reactive protein (CRP): Secondary | ICD-10-CM

## 2015-06-06 NOTE — Telephone Encounter (Signed)
Contacted this patient to get clarity of why she cancelled her appt to see Dr. Gavin Pound with [facility]. Patient informed me that it was too far of a drive in which she has attached a print out of the facility that she would like to become a patient. I apologized for the confusion and the inconvenience and stated that I would pass this information along and that if she did not hear from their office within a week to give [place] a call back.   Darrick Penna was informed of the new referral.

## 2015-06-07 ENCOUNTER — Telehealth: Payer: Self-pay

## 2015-06-07 ENCOUNTER — Encounter: Payer: Self-pay | Admitting: Family Medicine

## 2015-06-07 NOTE — Telephone Encounter (Signed)
Patient was informed of her appt on 07/16/15 at 1:15pm with Dr. Dossie Der. Patient stated she recently spoke with them and thanks for the call.

## 2015-06-11 ENCOUNTER — Telehealth: Payer: Self-pay

## 2015-06-11 MED ORDER — HYDROCODONE-ACETAMINOPHEN 10-325 MG PO TABS: 1.0000 | ORAL_TABLET | Freq: Four times a day (QID) | ORAL | Status: DC | PRN

## 2015-06-11 NOTE — Telephone Encounter (Signed)
Patient called and states her left lower back has been hurting her since Friday, and she has appointment with Neurosurgery next week. Is there anything she can do for the pain to help it ease off until then? She has tried the pain medication Hydrocodone, Gabapentin, and Flexeril. I asked patient if she has tried Ice and Heat and she states she has. She states she takes the pain medication and after 5 hours the excruciating pain is right back.

## 2015-06-11 NOTE — Telephone Encounter (Signed)
Spoke with Dr. Ancil Boozer over the phone per patient request about pain, and Dr. Ancil Boozer recommended patient double up on her existing Hydrocodone-Acetaminophen to two tablets every 6 hours for extreme pain for today only. Then to instruct patient to keep taking the Flexeril as prescribed and then call us back tomorrow to see how the pain medication was helping her pain. If the pain medication did help that per Dr. Ancil Boozer she would give her another hard copy prescription for the pain medication to what best helped her pain until she can be seen with Neurology and Rheumatology.

## 2015-06-12 ENCOUNTER — Telehealth: Payer: Self-pay | Admitting: Family Medicine

## 2015-06-12 DIAGNOSIS — F331 Major depressive disorder, recurrent, moderate: Secondary | ICD-10-CM | POA: Diagnosis not present

## 2015-06-12 DIAGNOSIS — Z600 Problems of adjustment to life-cycle transitions: Secondary | ICD-10-CM | POA: Diagnosis not present

## 2015-06-12 DIAGNOSIS — F411 Generalized anxiety disorder: Secondary | ICD-10-CM | POA: Diagnosis not present

## 2015-06-12 NOTE — Telephone Encounter (Signed)
Patient was told to double up on her pain medication and call back to inform the doctor if it helped. Patient states that it did help but she only have 5 pills left.

## 2015-06-12 NOTE — Telephone Encounter (Signed)
I already printed a rx for 10/325 mg to take instead of the 5/325 dose. To take one up to every 6 hours. It should be at [employer]

## 2015-06-13 NOTE — Telephone Encounter (Signed)
Patient notified and will pick up new prescription. Wanted to let you doubling up on her dosage has helped tremendously. Also wanted to let you know she can actually fill the bump on her back this time and thanks for helping her until she goes to Neurosurgeon.

## 2015-06-19 DIAGNOSIS — R222 Localized swelling, mass and lump, trunk: Secondary | ICD-10-CM | POA: Diagnosis not present

## 2015-06-26 DIAGNOSIS — Z600 Problems of adjustment to life-cycle transitions: Secondary | ICD-10-CM | POA: Diagnosis not present

## 2015-06-26 DIAGNOSIS — F331 Major depressive disorder, recurrent, moderate: Secondary | ICD-10-CM | POA: Diagnosis not present

## 2015-06-26 DIAGNOSIS — F411 Generalized anxiety disorder: Secondary | ICD-10-CM | POA: Diagnosis not present

## 2015-07-05 DIAGNOSIS — Z600 Problems of adjustment to life-cycle transitions: Secondary | ICD-10-CM | POA: Diagnosis not present

## 2015-07-05 DIAGNOSIS — F411 Generalized anxiety disorder: Secondary | ICD-10-CM | POA: Diagnosis not present

## 2015-07-05 DIAGNOSIS — F331 Major depressive disorder, recurrent, moderate: Secondary | ICD-10-CM | POA: Diagnosis not present

## 2015-07-12 ENCOUNTER — Telehealth: Payer: Self-pay

## 2015-07-12 NOTE — Telephone Encounter (Signed)
Miguel Dibble with [facility] called stating that she thinks she has finally encouraged this patient try a medication that will help with her depression  She stated that during her assessment she has noticed signs of "fleeting" but no signs of hurting herself or someone else. She has been displaying signs of classic depression.  Ms. Grandville Silos was informed that I will pass this information on to Dr. Ancil Boozer and see if she would like to consult with her prior to this patient's appointment tomorrow.

## 2015-07-13 ENCOUNTER — Encounter: Payer: Self-pay | Admitting: Family Medicine

## 2015-07-13 ENCOUNTER — Ambulatory Visit: Payer: Self-pay | Admitting: Family Medicine

## 2015-07-13 ENCOUNTER — Ambulatory Visit (INDEPENDENT_AMBULATORY_CARE_PROVIDER_SITE_OTHER): Payer: Medicare Other | Admitting: Family Medicine

## 2015-07-13 VITALS — BP 122/74 | HR 107 | Temp 98.1°F | Resp 18 | Ht 66.0 in | Wt 193.8 lb

## 2015-07-13 DIAGNOSIS — F331 Major depressive disorder, recurrent, moderate: Secondary | ICD-10-CM

## 2015-07-13 DIAGNOSIS — R222 Localized swelling, mass and lump, trunk: Secondary | ICD-10-CM

## 2015-07-13 DIAGNOSIS — R7982 Elevated C-reactive protein (CRP): Secondary | ICD-10-CM

## 2015-07-13 DIAGNOSIS — R29818 Other symptoms and signs involving the nervous system: Secondary | ICD-10-CM | POA: Diagnosis not present

## 2015-07-13 DIAGNOSIS — E1121 Type 2 diabetes mellitus with diabetic nephropathy: Secondary | ICD-10-CM

## 2015-07-13 DIAGNOSIS — G4700 Insomnia, unspecified: Secondary | ICD-10-CM

## 2015-07-13 DIAGNOSIS — I10 Essential (primary) hypertension: Secondary | ICD-10-CM | POA: Diagnosis not present

## 2015-07-13 MED ORDER — DULOXETINE HCL 30 MG PO CPEP: 30.0000 mg | ORAL_CAPSULE | Freq: Every day | ORAL | Status: DC

## 2015-07-13 MED ORDER — METFORMIN HCL ER 750 MG PO TB24: 750.0000 mg | ORAL_TABLET | Freq: Every day | ORAL | Status: DC

## 2015-07-13 MED ORDER — ZOLPIDEM TARTRATE 10 MG PO TABS: 10.0000 mg | ORAL_TABLET | Freq: Every day | ORAL | Status: DC

## 2015-07-13 NOTE — Telephone Encounter (Signed)
Christine Jensen was informed that this patient was started on Cymbalta today. She said thanks for the update and she will see how she does on it since she has a f/u with her in about 2 weeks.

## 2015-07-13 NOTE — Progress Notes (Signed)
Name: Christine Jensen   MRN: [id number]    DOB: Oct 21, 1950   Date:07/13/2015       Progress Note  Subjective  Chief Complaint  Chief Complaint  Patient presents with  . Follow-up    patient is here for her 26-month f/u  . URI    HPI  Chronic back pain: she had shingles and diagnosed with post-herpetic neuralgia, but symptoms go worse and MRI of thoracic spine showed paraspinal growth. She has seen neurosurgeon Dr. Glade Nurse for second opinion at [facility], but mass has been stable in size since 2011.She has been referred to a thoracic surgeon for possible robotic surgery of paraspinal mass. She is taking Gabapentin .   Major Depression: she is still very depressed, frustrated with husband with poor memory, she still sees therapist, but not on medication - however she is willing to take it now. Struggling with husband with PTSD, and lack of executive function   Diabetes type II: she has been taking medication, denies polyphagia, polyuria or polydipsia. Average glucose fasting is around 110.  She has intermittent hypoglycemia but only when she skips meals - secondary to inability to taste her food.  She is taking sulfonylurea Explained risk of hypoglycemia , skip medication if you will skip a meal. Advised to change to Metformin ER   Insomnia: she is taking Ambien but only sleeps for about 3 hours. She states that she used to sleep for 6 hours. Sleeps better when she takes pain medication. However she states her depression/anxiety is worse and her mind is always busy  Hypertension: taking medication and denies side effects of medication. Denies chest pain or palpitation  Elevated C-reactive protein: joint pains , has follow up with Rheumatologist next week  URI: she states that symptoms started last week with headache, joint aches, ear fullness, nasal congestion, rhinorrhea, scratchy throat. Symptoms have improved a little with otc medication. No fever   Patient Active Problem List    Diagnosis Date Noted  . Absence of sense of taste 02/09/2015  . Paraspinal mass 02/09/2015  . Hypertension, benign 02/09/2015  . Postherpetic neuralgia 02/09/2015  . IBS (irritable bowel syndrome) 12/08/2014  . Insomnia, persistent 12/08/2014  . Chronic back pain 12/08/2014  . Depression, major, recurrent, moderate ([place]) 11/24/2014  . Generalized anxiety disorder 11/24/2014  . Diabetes ([place]) 09/15/2014  . Hypercholesteremia 09/15/2014  . Hypothyroid 09/15/2014    Past Surgical History  Procedure Laterality Date  . Appendectomy    . Hernia repair    . Abdominal perineal bowel resection      Family History  Problem Relation Age of Onset  . Heart disease Mother   . Alzheimer's disease Mother   . Arthritis Father     Social History   Social History  . Marital Status: Married    Spouse Name: N/A  . Number of Children: N/A  . Years of Education: N/A   Occupational History  . Not on file.   Social History Main Topics  . Smoking status: Never Smoker   . Smokeless tobacco: Never Used  . Alcohol Use: 0.0 oz/week    0 Standard drinks or equivalent per week     Comment: rarely  . Drug Use: No  . Sexual Activity:    Partners: Male    Birth Control/ Protection: None   Other Topics Concern  . Not on file   Social History Narrative   ** Merged History Encounter **         Current  outpatient prescriptions:  .  acetaminophen (TYLENOL) 500 MG tablet, Take 1 tablet (500 mg total) by mouth every 6 (six) hours as needed., Disp: 30 tablet, Rfl: 0 .  aspirin 81 MG tablet, Take 1 tablet by mouth daily., Disp: , Rfl:  .  Clobetasol Propionate 0.05 % shampoo, , Disp: , Rfl:  .  clonazePAM (KLONOPIN) 0.5 MG tablet, , Disp: , Rfl:  .  cyclobenzaprine (FLEXERIL) 10 MG tablet, TAKE 1 TABLET BY MOUTH UP TO THREE TIMES DAILY FOR MUSCLE SPASMS, Disp: 90 tablet, Rfl: 0 .  dicyclomine (BENTYL) 20 MG tablet, Take 20 mg by mouth 3 (three) times daily., Disp: , Rfl: 4 .  diltiazem  (DILACOR XR) 180 MG 24 hr capsule, Take 1 capsule (180 mg total) by mouth daily., Disp: 90 capsule, Rfl: 1 .  DULoxetine (CYMBALTA) 30 MG capsule, Take 1 capsule (30 mg total) by mouth daily. After the first week increase to two pills daily after that, Disp: 60 capsule, Rfl: 0 .  Gabapentin, Once-Daily, 300 MG TABS, Take 1 capsule by mouth at bedtime., Disp: 90 tablet, Rfl: 4 .  glyBURIDE-metformin (GLUCOVANCE) 2.5-500 MG per tablet, Take 1 tablet by mouth 2 (two) times daily., Disp: 180 tablet, Rfl: 1 .  HYDROcodone-acetaminophen (NORCO) 10-325 MG tablet, Take 1 tablet by mouth every 6 (six) hours as needed., Disp: 40 tablet, Rfl: 0 .  ibuprofen (ADVIL,MOTRIN) 800 MG tablet, Take 800 mg by mouth every 8 (eight) hours., Disp: , Rfl: 6 .  JUBLIA 10 % SOLN, , Disp: , Rfl:  .  levothyroxine (SYNTHROID, LEVOTHROID) 112 MCG tablet, Take 1 tablet (112 mcg total) by mouth daily before breakfast., Disp: 90 tablet, Rfl: 1 .  lidocaine (LIDODERM) 5 %, , Disp: , Rfl: 2 .  losartan-hydrochlorothiazide (HYZAAR) 50-12.5 MG per tablet, Take 1 tablet by mouth daily., Disp: 90 tablet, Rfl: 1 .  Magnesium Gluconate 250 MG TABS, Take 1 tablet by mouth daily., Disp: , Rfl:  .  Melatonin 10 MG CAPS, Take 1 tablet by mouth daily., Disp: , Rfl:  .  mometasone (ELOCON) 0.1 % lotion, APPLY TO ITCHY EARS TWICE DAILY FOR 12 DAYS. MAY REPEAT AS NEEDED FOR ITCHY EARS, Disp: , Rfl: 10 .  pravastatin (PRAVACHOL) 40 MG tablet, Take 1 tablet (40 mg total) by mouth daily., Disp: 90 tablet, Rfl: 1 .  pyrithione zinc (HEAD AND SHOULDERS) 1 % shampoo, Apply topically daily as needed for itching., Disp: , Rfl:  .  zolpidem (AMBIEN) 10 MG tablet, Take 1 tablet (10 mg total) by mouth at bedtime., Disp: 90 tablet, Rfl: 0  Allergies  Allergen Reactions  . Erythromycin Shortness Of Breath  . Macadamia Nut Oil Shortness Of Breath    Oily nuts   . Codeine   . Penicillins   . Sulfa Antibiotics Other (See Comments)    Joints become  painful   . Other Rash    Shoes      ROS  Constitutional: Negative for fever but positive for  weight change.  Respiratory: Negative for cough and shortness of breath.   Cardiovascular: Negative for chest pain or palpitations.  Gastrointestinal: Negative for abdominal pain, no bowel changes.  Musculoskeletal: Positive  for gait problem no joint swelling.  Skin: Negative for rash.  Neurological: Negative for dizziness or headache.  No other specific complaints in a complete review of systems (except as listed in HPI above).  Objective  Filed Vitals:   07/13/15 1327  BP: 122/74  Pulse: 107  Temp:  98.1 F (36.7 C)  TempSrc: Oral  Resp: 18  Height: 5\' 6"  (1.676 m)  Weight: 193 lb 12.8 oz (87.907 kg)  SpO2: 97%    Body mass index is 31.3 kg/(m^2).  Physical Exam  Constitutional: Patient appears well-developed and well-nourished. Obese  No distress.  HEENT: head atraumatic, normocephalic, pupils equal and reactive to light,  neck supple, throat within normal limits Cardiovascular: Normal rate, regular rhythm and normal heart sounds.  No murmur heard. No BLE edema. Pulmonary/Chest: Effort normal and breath sounds normal. No respiratory distress. Abdominal: Soft.  There is no tenderness. Psychiatric: Patient has a normal mood and affect. behavior is normal. Judgment and thought content normal. Muscular Skeletal: pain during palpation of mass on right paraspinal area  PHQ2/9: Depression screen [facility] 2/9 07/13/2015 12/08/2014  Decreased Interest 0 0  Down, Depressed, Hopeless 3 0  PHQ - 2 Score 3 0  Altered sleeping 2 -  Tired, decreased energy 2 -  Change in appetite 0 -  Feeling bad or failure about yourself  1 -  Trouble concentrating 1 -  Moving slowly or fidgety/restless 0 -  Suicidal thoughts 0 -  PHQ-9 Score 9 -  Difficult doing work/chores Very difficult -     Fall Risk: Fall Risk  07/13/2015 02/09/2015 12/08/2014  Falls in the past year? No No No      Functional Status Survey: Is the patient deaf or have difficulty hearing?: No Does the patient have difficulty seeing, even when wearing glasses/contacts?: No Does the patient have difficulty concentrating, remembering, or making decisions?: Yes Does the patient have difficulty walking or climbing stairs?: No Does the patient have difficulty dressing or bathing?: No Does the patient have difficulty doing errands alone such as visiting a doctor's office or shopping?: No  Assessment & Plan  1. Paraspinal mass  Follow neurosurgeons' recommendation  2. Elevated C-reactive protein (CRP)  Keep follow up with Rheumatologist  3. Depression, major, recurrent, moderate (HCC)  - DULoxetine (CYMBALTA) 30 MG capsule; Take 1 capsule (30 mg total) by mouth daily. After the first week increase to two pills daily after that  Dispense: 60 capsule; Refill: 0  4. Controlled type 2 diabetes mellitus with diabetic nephropathy, without long-term current use of insulin (HCC)  At goal, but skipping meals we will change to Metformin 750 mg #60 - POCT HgB A1C  5. Insomnia, persistent  - zolpidem (AMBIEN) 10 MG tablet; Take 1 tablet (10 mg total) by mouth at bedtime.  Dispense: 90 tablet; Refill: 0 Not sleeping well, hopefully it will improve with Cymbalta   6. Hypertension, benign  Well controlled

## 2015-07-13 NOTE — Telephone Encounter (Signed)
Started her on Cymbalta today. Please notify Christine Jensen. Thank you

## 2015-07-16 DIAGNOSIS — M353 Polymyalgia rheumatica: Secondary | ICD-10-CM | POA: Diagnosis not present

## 2015-07-16 DIAGNOSIS — L4052 Psoriatic arthritis mutilans: Secondary | ICD-10-CM | POA: Diagnosis not present

## 2015-07-16 DIAGNOSIS — L4050 Arthropathic psoriasis, unspecified: Secondary | ICD-10-CM | POA: Diagnosis not present

## 2015-07-16 DIAGNOSIS — M1990 Unspecified osteoarthritis, unspecified site: Secondary | ICD-10-CM | POA: Diagnosis not present

## 2015-07-16 DIAGNOSIS — L409 Psoriasis, unspecified: Secondary | ICD-10-CM | POA: Diagnosis not present

## 2015-07-19 DIAGNOSIS — F411 Generalized anxiety disorder: Secondary | ICD-10-CM | POA: Diagnosis not present

## 2015-07-19 DIAGNOSIS — F331 Major depressive disorder, recurrent, moderate: Secondary | ICD-10-CM | POA: Diagnosis not present

## 2015-07-19 DIAGNOSIS — Z600 Problems of adjustment to life-cycle transitions: Secondary | ICD-10-CM | POA: Diagnosis not present

## 2015-07-26 ENCOUNTER — Telehealth: Payer: Self-pay | Admitting: Family Medicine

## 2015-07-26 NOTE — Telephone Encounter (Signed)
ERENOUS

## 2015-07-30 DIAGNOSIS — M1990 Unspecified osteoarthritis, unspecified site: Secondary | ICD-10-CM | POA: Diagnosis not present

## 2015-07-30 DIAGNOSIS — R5383 Other fatigue: Secondary | ICD-10-CM | POA: Diagnosis not present

## 2015-07-30 DIAGNOSIS — M353 Polymyalgia rheumatica: Secondary | ICD-10-CM | POA: Diagnosis not present

## 2015-07-30 DIAGNOSIS — L409 Psoriasis, unspecified: Secondary | ICD-10-CM | POA: Diagnosis not present

## 2015-07-30 DIAGNOSIS — L4050 Arthropathic psoriasis, unspecified: Secondary | ICD-10-CM | POA: Diagnosis not present

## 2015-07-30 DIAGNOSIS — R918 Other nonspecific abnormal finding of lung field: Secondary | ICD-10-CM | POA: Diagnosis not present

## 2015-08-01 ENCOUNTER — Encounter: Payer: Self-pay | Admitting: Family Medicine

## 2015-08-01 DIAGNOSIS — L4050 Arthropathic psoriasis, unspecified: Secondary | ICD-10-CM | POA: Insufficient documentation

## 2015-08-01 DIAGNOSIS — M353 Polymyalgia rheumatica: Secondary | ICD-10-CM | POA: Insufficient documentation

## 2015-08-01 DIAGNOSIS — M1990 Unspecified osteoarthritis, unspecified site: Secondary | ICD-10-CM | POA: Insufficient documentation

## 2015-08-02 DIAGNOSIS — Z600 Problems of adjustment to life-cycle transitions: Secondary | ICD-10-CM | POA: Diagnosis not present

## 2015-08-02 DIAGNOSIS — F331 Major depressive disorder, recurrent, moderate: Secondary | ICD-10-CM | POA: Diagnosis not present

## 2015-08-02 DIAGNOSIS — F411 Generalized anxiety disorder: Secondary | ICD-10-CM | POA: Diagnosis not present

## 2015-08-08 ENCOUNTER — Other Ambulatory Visit: Payer: Self-pay | Admitting: Family Medicine

## 2015-08-08 NOTE — Telephone Encounter (Signed)
Please ask if she is taking 2. I can send a 60 mg capsule instead of two 30 mg daily

## 2015-08-09 NOTE — Telephone Encounter (Signed)
Patient stated she is taking 2 pills now so the 60 mg will be perfect.

## 2015-08-09 NOTE — Telephone Encounter (Signed)
Tried to reach this patient again regarding her medication refill, but there was no answer. Another message was left for her to give [place] a call when she got the chance.

## 2015-08-13 DIAGNOSIS — L4059 Other psoriatic arthropathy: Secondary | ICD-10-CM | POA: Diagnosis not present

## 2015-08-13 DIAGNOSIS — L400 Psoriasis vulgaris: Secondary | ICD-10-CM | POA: Diagnosis not present

## 2015-08-14 ENCOUNTER — Encounter: Payer: Self-pay | Admitting: Family Medicine

## 2015-08-14 ENCOUNTER — Ambulatory Visit (INDEPENDENT_AMBULATORY_CARE_PROVIDER_SITE_OTHER): Payer: Medicare Other | Admitting: Family Medicine

## 2015-08-14 VITALS — BP 110/60 | HR 80 | Temp 98.1°F | Resp 16 | Ht 66.0 in | Wt 196.0 lb

## 2015-08-14 DIAGNOSIS — E1121 Type 2 diabetes mellitus with diabetic nephropathy: Secondary | ICD-10-CM | POA: Diagnosis not present

## 2015-08-14 DIAGNOSIS — R29818 Other symptoms and signs involving the nervous system: Secondary | ICD-10-CM

## 2015-08-14 DIAGNOSIS — F331 Major depressive disorder, recurrent, moderate: Secondary | ICD-10-CM

## 2015-08-14 DIAGNOSIS — L4050 Arthropathic psoriasis, unspecified: Secondary | ICD-10-CM | POA: Diagnosis not present

## 2015-08-14 DIAGNOSIS — R222 Localized swelling, mass and lump, trunk: Secondary | ICD-10-CM

## 2015-08-14 LAB — POCT GLYCOSYLATED HEMOGLOBIN (HGB A1C): Hemoglobin A1C: 6.3

## 2015-08-14 MED ORDER — DULOXETINE HCL 60 MG PO CPEP: 60.0000 mg | ORAL_CAPSULE | Freq: Every day | ORAL | Status: DC

## 2015-08-14 NOTE — Progress Notes (Signed)
Name: Christine Jensen   MRN: [id number]    DOB: August 02, 1950   Date:08/14/2015       Progress Note  Subjective  Chief Complaint  Chief Complaint  Patient presents with  . Insomnia  . Hypertension  . Diabetes  . Depression  . Paraspinal mass  . Elevated C-reactive protein    HPI  Major Depression: she is started on Cymbalta about 5 weeks ago, she is feeling much better. She states that a cloud has been lifted from her. She is much more calm and patient, also more motivation to go outside and do things instead of just sitting at home. Appetite has not improved, but she makes sure that she eats so she can take her medication.   Diabetes type II: she was given prednisone by Rheumatologist and glucose went very high, in the 300's. She went back on Glyburide and did not start Metformin, she is tapering off the prednisone and glucose is coming down. High of 180 now. Discussed adding metformin and once glucose goes down to 100 range she can try stopping glyburide.     Paraspinal mass: she still has daily right thoracic pain, seeing neurosurgeon next week.   Psoriatic Arthritis: patient is now on Prednisone ( tapering off) and on Methotrexate. She is feeling better, able to walk again. Pain now is 1-2/10, mild tenderness on right hip only now.    Patient Active Problem List   Diagnosis Date Noted  . Osteoarthritis 08/01/2015  . Psoriatic arthritis ([place]) 08/01/2015  . Absence of sense of taste 02/09/2015  . Paraspinal mass 02/09/2015  . Hypertension, benign 02/09/2015  . Postherpetic neuralgia 02/09/2015  . IBS (irritable bowel syndrome) 12/08/2014  . Insomnia, persistent 12/08/2014  . Chronic back pain 12/08/2014  . Depression, major, recurrent, moderate ([place]) 11/24/2014  . Generalized anxiety disorder 11/24/2014  . Diabetes ([place]) 09/15/2014  . Hypercholesteremia 09/15/2014  . Hypothyroid 09/15/2014    Past Surgical History  Procedure Laterality Date  . Appendectomy    . Hernia  repair    . Abdominal perineal bowel resection      Family History  Problem Relation Age of Onset  . Heart disease Mother   . Alzheimer's disease Mother   . Arthritis Father     Social History   Social History  . Marital Status: Married    Spouse Name: N/A  . Number of Children: N/A  . Years of Education: N/A   Occupational History  . Not on file.   Social History Main Topics  . Smoking status: Never Smoker   . Smokeless tobacco: Never Used  . Alcohol Use: 0.0 oz/week    0 Standard drinks or equivalent per week     Comment: rarely  . Drug Use: No  . Sexual Activity:    Partners: Male    Birth Control/ Protection: None   Other Topics Concern  . Not on file   Social History Narrative   ** Merged History Encounter **         Current outpatient prescriptions:  .  acetaminophen (TYLENOL) 500 MG tablet, Take 1 tablet (500 mg total) by mouth every 6 (six) hours as needed., Disp: 30 tablet, Rfl: 0 .  aspirin 81 MG tablet, Take 1 tablet by mouth daily., Disp: , Rfl:  .  clobetasol (TEMOVATE) 0.05 % external solution, , Disp: , Rfl:  .  Clobetasol Propionate 0.05 % shampoo, , Disp: , Rfl:  .  clonazePAM (KLONOPIN) 0.5 MG tablet, , Disp: , Rfl:  .  cyclobenzaprine (FLEXERIL) 10 MG tablet, TAKE 1 TABLET BY MOUTH UP TO THREE TIMES DAILY FOR MUSCLE SPASMS, Disp: 90 tablet, Rfl: 0 .  dicyclomine (BENTYL) 20 MG tablet, Take 20 mg by mouth 3 (three) times daily., Disp: , Rfl: 4 .  diltiazem (DILACOR XR) 180 MG 24 hr capsule, Take 1 capsule (180 mg total) by mouth daily., Disp: 90 capsule, Rfl: 1 .  DULoxetine (CYMBALTA) 60 MG capsule, Take 1 capsule (60 mg total) by mouth daily., Disp: 90 capsule, Rfl: 0 .  folic acid (FOLVITE) 1 MG tablet, Take 1 mg by mouth daily., Disp: , Rfl: 2 .  Gabapentin, Once-Daily, 300 MG TABS, Take 1 capsule by mouth at bedtime., Disp: 90 tablet, Rfl: 4 .  HYDROcodone-acetaminophen (NORCO) 10-325 MG tablet, Take 1 tablet by mouth every 6 (six) hours as  needed., Disp: 40 tablet, Rfl: 0 .  ibuprofen (ADVIL,MOTRIN) 800 MG tablet, Take 800 mg by mouth every 8 (eight) hours., Disp: , Rfl: 6 .  JUBLIA 10 % SOLN, , Disp: , Rfl:  .  levothyroxine (SYNTHROID, LEVOTHROID) 112 MCG tablet, Take 1 tablet (112 mcg total) by mouth daily before breakfast., Disp: 90 tablet, Rfl: 1 .  lidocaine (LIDODERM) 5 %, , Disp: , Rfl: 2 .  losartan-hydrochlorothiazide (HYZAAR) 50-12.5 MG per tablet, Take 1 tablet by mouth daily., Disp: 90 tablet, Rfl: 1 .  Magnesium Gluconate 250 MG TABS, Take 1 tablet by mouth daily., Disp: , Rfl:  .  Melatonin 10 MG CAPS, Take 1 tablet by mouth daily., Disp: , Rfl:  .  metFORMIN (GLUCOPHAGE-XR) 750 MG 24 hr tablet, Take 1 tablet (750 mg total) by mouth daily with breakfast., Disp: 60 tablet, Rfl: 0 .  methotrexate (RHEUMATREX) 2.5 MG tablet, TAKE 4 TABLETS ONCE A WEEK, Disp: , Rfl: 2 .  mometasone (ELOCON) 0.1 % lotion, APPLY TO ITCHY EARS TWICE DAILY FOR 12 DAYS. MAY REPEAT AS NEEDED FOR ITCHY EARS, Disp: , Rfl: 10 .  pravastatin (PRAVACHOL) 40 MG tablet, Take 1 tablet (40 mg total) by mouth daily., Disp: 90 tablet, Rfl: 1 .  predniSONE (DELTASONE) 10 MG tablet, Take 10 mg by mouth 2 (two) times daily., Disp: , Rfl: 0 .  pyrithione zinc (HEAD AND SHOULDERS) 1 % shampoo, Apply topically daily as needed for itching., Disp: , Rfl:  .  zolpidem (AMBIEN) 10 MG tablet, Take 1 tablet (10 mg total) by mouth at bedtime., Disp: 90 tablet, Rfl: 0  Allergies  Allergen Reactions  . Erythromycin Shortness Of Breath  . Macadamia Nut Oil Shortness Of Breath    Oily nuts   . Codeine   . Penicillins   . Sulfa Antibiotics Other (See Comments)    Joints become painful   . Aspartame Rash  . Other Rash    Shoes and Environmental     ROS  Ten systems reviewed and is negative except as mentioned in HPI   Objective  Filed Vitals:   08/14/15 1353  BP: 110/60  Pulse: 80  Temp: 98.1 F (36.7 C)  TempSrc: Oral  Resp: 16  Height: 5\' 6"   (1.676 m)  Weight: 196 lb (88.905 kg)  SpO2: 96%    Body mass index is 31.65 kg/(m^2).  Physical Exam  Constitutional: Patient appears well-developed and well-nourished. Obese No distress.  HEENT: head atraumatic, normocephalic, pupils equal and reactive to light,  neck supple, throat within normal limits Cardiovascular: Normal rate, regular rhythm and normal heart sounds.  No murmur heard. No BLE edema. Pulmonary/Chest: Effort  normal and breath sounds normal. No respiratory distress. Abdominal: Soft.  There is no tenderness. Psychiatric: Patient has a normal mood and affect. behavior is normal. Judgment and thought content normal.  PHQ2/9: Depression screen [facility] 2/9 08/14/2015 07/13/2015 12/08/2014  Decreased Interest 0 0 0  Down, Depressed, Hopeless 0 3 0  PHQ - 2 Score 0 3 0  Altered sleeping - 2 -  Tired, decreased energy - 2 -  Change in appetite - 0 -  Feeling bad or failure about yourself  - 1 -  Trouble concentrating - 1 -  Moving slowly or fidgety/restless - 0 -  Suicidal thoughts - 0 -  PHQ-9 Score - 9 -  Difficult doing work/chores - Very difficult -    Fall Risk: Fall Risk  08/14/2015 07/13/2015 02/09/2015 12/08/2014  Falls in the past year? No No No No     Functional Status Survey: Is the patient deaf or have difficulty hearing?: No Does the patient have difficulty seeing, even when wearing glasses/contacts?: No Does the patient have difficulty concentrating, remembering, or making decisions?: No Does the patient have difficulty walking or climbing stairs?: No Does the patient have difficulty dressing or bathing?: No Does the patient have difficulty doing errands alone such as visiting a doctor's office or shopping?: No    Assessment & Plan  1. Depression, major, recurrent, moderate ([place])  Doing well on medication, no side effects - DULoxetine (CYMBALTA) 60 MG capsule; Take 1 capsule (60 mg total) by mouth daily.  Dispense: 90 capsule; Refill: 0  2.  Psoriatic arthritis ([place])  Continue follow up with Psoriatic arthritis  3. Paraspinal mass  Keep follow up with neurosurgeon  4. Controlled type 2 diabetes mellitus with diabetic nephropathy, without long-term current use of insulin (HCC)  Continue medications, and walking .

## 2015-08-21 DIAGNOSIS — Z79899 Other long term (current) drug therapy: Secondary | ICD-10-CM | POA: Diagnosis not present

## 2015-08-21 DIAGNOSIS — Z79891 Long term (current) use of opiate analgesic: Secondary | ICD-10-CM | POA: Diagnosis not present

## 2015-08-21 DIAGNOSIS — R911 Solitary pulmonary nodule: Secondary | ICD-10-CM | POA: Diagnosis not present

## 2015-08-21 DIAGNOSIS — Z7982 Long term (current) use of aspirin: Secondary | ICD-10-CM | POA: Diagnosis not present

## 2015-08-21 DIAGNOSIS — R222 Localized swelling, mass and lump, trunk: Secondary | ICD-10-CM | POA: Diagnosis not present

## 2015-08-21 DIAGNOSIS — G8929 Other chronic pain: Secondary | ICD-10-CM | POA: Diagnosis not present

## 2015-08-21 DIAGNOSIS — M549 Dorsalgia, unspecified: Secondary | ICD-10-CM | POA: Diagnosis not present

## 2015-08-21 DIAGNOSIS — R918 Other nonspecific abnormal finding of lung field: Secondary | ICD-10-CM | POA: Diagnosis not present

## 2015-08-28 DIAGNOSIS — M1990 Unspecified osteoarthritis, unspecified site: Secondary | ICD-10-CM | POA: Diagnosis not present

## 2015-08-28 DIAGNOSIS — M353 Polymyalgia rheumatica: Secondary | ICD-10-CM | POA: Diagnosis not present

## 2015-08-28 DIAGNOSIS — L4050 Arthropathic psoriasis, unspecified: Secondary | ICD-10-CM | POA: Diagnosis not present

## 2015-08-28 DIAGNOSIS — L409 Psoriasis, unspecified: Secondary | ICD-10-CM | POA: Diagnosis not present

## 2015-08-28 DIAGNOSIS — Z79899 Other long term (current) drug therapy: Secondary | ICD-10-CM | POA: Diagnosis not present

## 2015-08-30 DIAGNOSIS — E119 Type 2 diabetes mellitus without complications: Secondary | ICD-10-CM | POA: Diagnosis not present

## 2015-09-07 ENCOUNTER — Other Ambulatory Visit: Payer: Self-pay | Admitting: Family Medicine

## 2015-09-07 NOTE — Telephone Encounter (Signed)
Patient requesting refill. 

## 2015-09-17 ENCOUNTER — Telehealth: Payer: Self-pay

## 2015-09-17 NOTE — Telephone Encounter (Signed)
Erroneous entry

## 2015-09-19 DIAGNOSIS — M5134 Other intervertebral disc degeneration, thoracic region: Secondary | ICD-10-CM | POA: Diagnosis not present

## 2015-09-27 DIAGNOSIS — F331 Major depressive disorder, recurrent, moderate: Secondary | ICD-10-CM | POA: Diagnosis not present

## 2015-09-27 DIAGNOSIS — F411 Generalized anxiety disorder: Secondary | ICD-10-CM | POA: Diagnosis not present

## 2015-09-27 DIAGNOSIS — Z600 Problems of adjustment to life-cycle transitions: Secondary | ICD-10-CM | POA: Diagnosis not present

## 2015-09-28 DIAGNOSIS — G8929 Other chronic pain: Secondary | ICD-10-CM | POA: Diagnosis not present

## 2015-09-28 DIAGNOSIS — M546 Pain in thoracic spine: Secondary | ICD-10-CM | POA: Diagnosis not present

## 2015-10-01 ENCOUNTER — Other Ambulatory Visit: Payer: Self-pay | Admitting: Family Medicine

## 2015-10-01 DIAGNOSIS — G4700 Insomnia, unspecified: Secondary | ICD-10-CM

## 2015-10-01 MED ORDER — ZOLPIDEM TARTRATE 10 MG PO TABS: 10.0000 mg | ORAL_TABLET | Freq: Every day | ORAL | Status: DC

## 2015-10-01 NOTE — Telephone Encounter (Signed)
Refill request was sent to Dr. Krichna Sowles for approval and submission.  

## 2015-10-01 NOTE — Telephone Encounter (Signed)
PT RETURNING YOUR CALL. PLEASE CALL HER BACK.

## 2015-10-04 DIAGNOSIS — M546 Pain in thoracic spine: Secondary | ICD-10-CM | POA: Diagnosis not present

## 2015-10-04 DIAGNOSIS — G8929 Other chronic pain: Secondary | ICD-10-CM | POA: Diagnosis not present

## 2015-10-08 DIAGNOSIS — G8929 Other chronic pain: Secondary | ICD-10-CM | POA: Diagnosis not present

## 2015-10-08 DIAGNOSIS — M546 Pain in thoracic spine: Secondary | ICD-10-CM | POA: Diagnosis not present

## 2015-10-30 DIAGNOSIS — Z79899 Other long term (current) drug therapy: Secondary | ICD-10-CM | POA: Diagnosis not present

## 2015-10-30 DIAGNOSIS — M353 Polymyalgia rheumatica: Secondary | ICD-10-CM | POA: Diagnosis not present

## 2015-10-30 DIAGNOSIS — L4050 Arthropathic psoriasis, unspecified: Secondary | ICD-10-CM | POA: Diagnosis not present

## 2015-10-30 DIAGNOSIS — L409 Psoriasis, unspecified: Secondary | ICD-10-CM | POA: Diagnosis not present

## 2015-10-30 DIAGNOSIS — M1990 Unspecified osteoarthritis, unspecified site: Secondary | ICD-10-CM | POA: Diagnosis not present

## 2015-11-05 DIAGNOSIS — M546 Pain in thoracic spine: Secondary | ICD-10-CM | POA: Diagnosis not present

## 2015-11-05 DIAGNOSIS — G8929 Other chronic pain: Secondary | ICD-10-CM | POA: Diagnosis not present

## 2015-11-08 DIAGNOSIS — F331 Major depressive disorder, recurrent, moderate: Secondary | ICD-10-CM | POA: Diagnosis not present

## 2015-11-08 DIAGNOSIS — F411 Generalized anxiety disorder: Secondary | ICD-10-CM | POA: Diagnosis not present

## 2015-11-08 DIAGNOSIS — Z600 Problems of adjustment to life-cycle transitions: Secondary | ICD-10-CM | POA: Diagnosis not present

## 2015-11-12 DIAGNOSIS — G8929 Other chronic pain: Secondary | ICD-10-CM | POA: Diagnosis not present

## 2015-11-12 DIAGNOSIS — M546 Pain in thoracic spine: Secondary | ICD-10-CM | POA: Diagnosis not present

## 2015-11-13 ENCOUNTER — Ambulatory Visit (INDEPENDENT_AMBULATORY_CARE_PROVIDER_SITE_OTHER): Payer: Medicare Other | Admitting: Family Medicine

## 2015-11-13 ENCOUNTER — Encounter: Payer: Self-pay | Admitting: Family Medicine

## 2015-11-13 VITALS — BP 118/64 | HR 97 | Temp 98.6°F | Resp 16 | Wt 197.7 lb

## 2015-11-13 DIAGNOSIS — E038 Other specified hypothyroidism: Secondary | ICD-10-CM | POA: Diagnosis not present

## 2015-11-13 DIAGNOSIS — F331 Major depressive disorder, recurrent, moderate: Secondary | ICD-10-CM | POA: Diagnosis not present

## 2015-11-13 DIAGNOSIS — R55 Syncope and collapse: Secondary | ICD-10-CM | POA: Diagnosis not present

## 2015-11-13 DIAGNOSIS — M549 Dorsalgia, unspecified: Secondary | ICD-10-CM

## 2015-11-13 DIAGNOSIS — I10 Essential (primary) hypertension: Secondary | ICD-10-CM | POA: Diagnosis not present

## 2015-11-13 DIAGNOSIS — R29818 Other symptoms and signs involving the nervous system: Secondary | ICD-10-CM | POA: Diagnosis not present

## 2015-11-13 DIAGNOSIS — N183 Chronic kidney disease, stage 3 unspecified: Secondary | ICD-10-CM

## 2015-11-13 DIAGNOSIS — E785 Hyperlipidemia, unspecified: Secondary | ICD-10-CM

## 2015-11-13 DIAGNOSIS — G8929 Other chronic pain: Secondary | ICD-10-CM

## 2015-11-13 DIAGNOSIS — M353 Polymyalgia rheumatica: Secondary | ICD-10-CM | POA: Diagnosis not present

## 2015-11-13 DIAGNOSIS — G4700 Insomnia, unspecified: Secondary | ICD-10-CM

## 2015-11-13 DIAGNOSIS — R222 Localized swelling, mass and lump, trunk: Secondary | ICD-10-CM

## 2015-11-13 DIAGNOSIS — E1122 Type 2 diabetes mellitus with diabetic chronic kidney disease: Secondary | ICD-10-CM | POA: Diagnosis not present

## 2015-11-13 DIAGNOSIS — F411 Generalized anxiety disorder: Secondary | ICD-10-CM

## 2015-11-13 MED ORDER — ZOLPIDEM TARTRATE ER 12.5 MG PO TBCR: 12.5000 mg | EXTENDED_RELEASE_TABLET | Freq: Every evening | ORAL | Status: DC | PRN

## 2015-11-13 MED ORDER — LOSARTAN POTASSIUM-HCTZ 50-12.5 MG PO TABS: 1.0000 | ORAL_TABLET | Freq: Every day | ORAL | Status: DC

## 2015-11-13 MED ORDER — DILTIAZEM HCL ER 180 MG PO CP24: 180.0000 mg | ORAL_CAPSULE | Freq: Every day | ORAL | Status: DC

## 2015-11-13 MED ORDER — GABAPENTIN 100 MG PO CAPS: 100.0000 mg | ORAL_CAPSULE | Freq: Every day | ORAL | Status: DC

## 2015-11-13 MED ORDER — DULOXETINE HCL 30 MG PO CPEP: 30.0000 mg | ORAL_CAPSULE | Freq: Every day | ORAL | Status: DC

## 2015-11-13 MED ORDER — PRAVASTATIN SODIUM 40 MG PO TABS: 40.0000 mg | ORAL_TABLET | Freq: Every day | ORAL | Status: DC

## 2015-11-13 MED ORDER — CLONAZEPAM 0.5 MG PO TABS: 0.2500 mg | ORAL_TABLET | Freq: Every evening | ORAL | Status: DC | PRN

## 2015-11-13 NOTE — Progress Notes (Signed)
Name: Christine Jensen   MRN: [id number]    DOB: Jan 12, 1951   Date:11/13/2015       Progress Note  Subjective  Chief Complaint  Chief Complaint  Patient presents with  . Medication Refill  . Back Pain    right side of upper back  . Depression    HPI   Major Depression: she is started on Cymbalta about 4 months ago and initially  she was  feeling much better. She stated that a cloud had been lifted from her, but over the past month, symptoms are getting progressively worse. Her therapist has advised to increase Cymbalta to 90 mg daily. About one month ago she found out that she could not go to [place] , a trip that she was looking forward to. Also continues to be stressed by her husband's illness.    Diabetes type II: she is on prednisone given  by Rheumatologist, but down to 5 mg daily, glucose is still in the 1280's-190's. High of mid 200's . She went back on Glyburide advised to add Metformin, last GFR dropped ( labs done by Rheumatologist ). High of 180 now. Discussed adding metformin and once glucose goes down to 100 range she can try stopping glyburide.   Paraspinal mass: she still has daily right thoracic pain, seen by multiple doctors but is finally seeing Dr. Rudene Christians and is having PT and back pain has improved, except for one episode 2 weeks ago that was so intense that made her dizzy at home and she passed out in her kitchen, she fell on her bottom, she woke up sitting on the floor, she got up and took a pain medication and felt better. She state glucose after episode was 130, bp was also normal.No amnesia, loss of bowel or bladder. Advised referral to Neurologist if symptoms recurs, go to [facility] if it happens again but explained likely vaso-vagal.   Psoriatic Arthritis: patient is now on Prednisone ( tapering off down to 5 mg )  and on Methotrexate. She is feeling better, able to walk again. Pain now is 2-3/10, mild tenderness on right hip only now.   Polymyalgia Rheumatica: diagnosed by  Rheumatologist during the Summer of 2016 , still on Prednisone and still aches, shoulders are stiff, but worse pain is still on her back. She states prednisone makes her glucose go up  Insomnia: persistent, she has been taking Ambien before bed and also in the middle of the night and is running off medication. Explained that is above FDA approval, we will try Ambien CR and add alprazolam prn if she wakes up in the middle of the night. Explained Ambien is controlled and needs to take it as prescribed.   Hypothyroidism: seeing Dr. Ronnald Collum and per patient.   Patient Active Problem List   Diagnosis Date Noted  . Polymyalgia rheumatica ([place]) 11/13/2015  . Controlled type 2 diabetes mellitus with stage 3 chronic kidney disease, without long-term current use of insulin ([place]) 11/13/2015  . Osteoarthritis 08/01/2015  . Psoriatic arthritis ([place]) 08/01/2015  . Absence of sense of taste 02/09/2015  . Paraspinal mass 02/09/2015  . Hypertension, benign 02/09/2015  . Postherpetic neuralgia 02/09/2015  . IBS (irritable bowel syndrome) 12/08/2014  . Insomnia, persistent 12/08/2014  . Chronic back pain 12/08/2014  . Depression, major, recurrent, moderate ([place]) 11/24/2014  . Generalized anxiety disorder 11/24/2014  . Hypercholesteremia 09/15/2014  . Hypothyroid 09/15/2014    Past Surgical History  Procedure Laterality Date  . Appendectomy    . Hernia  repair    . Abdominal perineal bowel resection      Family History  Problem Relation Age of Onset  . Heart disease Mother   . Alzheimer's disease Mother   . Arthritis Father     Social History   Social History  . Marital Status: Married    Spouse Name: N/A  . Number of Children: N/A  . Years of Education: N/A   Occupational History  . Not on file.   Social History Main Topics  . Smoking status: Never Smoker   . Smokeless tobacco: Never Used  . Alcohol Use: 0.0 oz/week    0 Standard drinks or equivalent per week     Comment: rarely   . Drug Use: No  . Sexual Activity:    Partners: Male    Birth Control/ Protection: None   Other Topics Concern  . Not on file   Social History Narrative   ** Merged History Encounter **         Current outpatient prescriptions:  .  predniSONE (DELTASONE) 5 MG tablet, Take 5 mg by mouth daily with breakfast., Disp: , Rfl:  .  acetaminophen (TYLENOL) 500 MG tablet, Take 1 tablet (500 mg total) by mouth every 6 (six) hours as needed., Disp: 30 tablet, Rfl: 0 .  aspirin 81 MG tablet, Take 1 tablet by mouth daily., Disp: , Rfl:  .  clobetasol (TEMOVATE) 0.05 % external solution, , Disp: , Rfl:  .  Clobetasol Propionate 0.05 % shampoo, , Disp: , Rfl:  .  clonazePAM (KLONOPIN) 0.5 MG tablet, Take 0.5-1 tablets (0.25-0.5 mg total) by mouth at bedtime as needed for anxiety., Disp: 30 tablet, Rfl: 0 .  cyclobenzaprine (FLEXERIL) 10 MG tablet, TAKE 1 TABLET BY MOUTH UP TO THREE TIMES DAILY FOR MUSCLE SPASMS, Disp: 90 tablet, Rfl: 0 .  dicyclomine (BENTYL) 20 MG tablet, Take 20 mg by mouth 3 (three) times daily., Disp: , Rfl: 4 .  diltiazem (DILACOR XR) 180 MG 24 hr capsule, Take 1 capsule (180 mg total) by mouth daily., Disp: 90 capsule, Rfl: 1 .  DULoxetine (CYMBALTA) 30 MG capsule, Take 1 capsule (30 mg total) by mouth daily., Disp: 270 capsule, Rfl: 0 .  folic acid (FOLVITE) 1 MG tablet, Take 1 mg by mouth daily., Disp: , Rfl: 2 .  gabapentin (NEURONTIN) 100 MG capsule, Take 1 capsule (100 mg total) by mouth at bedtime. And wean self off, Disp: 30 capsule, Rfl: 0 .  HYDROcodone-acetaminophen (NORCO) 10-325 MG tablet, Take 1 tablet by mouth every 6 (six) hours as needed., Disp: 40 tablet, Rfl: 0 .  ibuprofen (ADVIL,MOTRIN) 800 MG tablet, Take 800 mg by mouth every 8 (eight) hours., Disp: , Rfl: 6 .  JUBLIA 10 % SOLN, , Disp: , Rfl:  .  levothyroxine (SYNTHROID, LEVOTHROID) 112 MCG tablet, Take 1 tablet (112 mcg total) by mouth daily before breakfast., Disp: 90 tablet, Rfl: 1 .  lidocaine  (LIDODERM) 5 %, , Disp: , Rfl: 2 .  losartan-hydrochlorothiazide (HYZAAR) 50-12.5 MG tablet, Take 1 tablet by mouth daily., Disp: 90 tablet, Rfl: 1 .  Magnesium Gluconate 250 MG TABS, Take 1 tablet by mouth daily., Disp: , Rfl:  .  Melatonin 10 MG CAPS, Take 1 tablet by mouth daily., Disp: , Rfl:  .  metFORMIN (GLUCOPHAGE-XR) 750 MG 24 hr tablet, TAKE 1 TABLET BY MOUTH DAILY WITH BREAKFAST, Disp: 60 tablet, Rfl: 0 .  methotrexate (RHEUMATREX) 2.5 MG tablet, TAKE 4 TABLETS ONCE A WEEK, Disp: ,  Rfl: 2 .  mometasone (ELOCON) 0.1 % lotion, APPLY TO ITCHY EARS TWICE DAILY FOR 12 DAYS. MAY REPEAT AS NEEDED FOR ITCHY EARS, Disp: , Rfl: 10 .  pravastatin (PRAVACHOL) 40 MG tablet, Take 1 tablet (40 mg total) by mouth daily., Disp: 90 tablet, Rfl: 1 .  pyrithione zinc (HEAD AND SHOULDERS) 1 % shampoo, Apply topically daily as needed for itching., Disp: , Rfl:  .  zolpidem (AMBIEN CR) 12.5 MG CR tablet, Take 1 tablet (12.5 mg total) by mouth at bedtime as needed for sleep., Disp: 30 tablet, Rfl: 0  Allergies  Allergen Reactions  . Erythromycin Shortness Of Breath  . Macadamia Nut Oil Shortness Of Breath    Oily nuts   . Codeine   . Penicillins   . Sulfa Antibiotics Other (See Comments)    Joints become painful   . Aspartame Rash  . Other Rash    Shoes and Environmental     ROS  Constitutional: Negative for fever or weight change.  Respiratory: Negative for cough and shortness of breath.   Cardiovascular: Negative for chest pain or palpitations.  Gastrointestinal: Negative for abdominal pain, no bowel changes. She feels bloated - she has IBS Musculoskeletal: Negative for gait problem or joint swelling.  Skin: Negative for rash.  Neurological: Negative for dizziness or headache.  No other specific complaints in a complete review of systems (except as listed in HPI above).   Objective  Filed Vitals:   11/13/15 1347  BP: 118/64  Pulse: 97  Temp: 98.6 F (37 C)  TempSrc: Oral  Resp: 16   Weight: 197 lb 11.2 oz (89.676 kg)  SpO2: 96%    Body mass index is 31.92 kg/(m^2).  Physical Exam  Constitutional: Patient appears well-developed and well-nourished. Obese  No distress.  HEENT: head atraumatic, normocephalic, pupils equal and reactive to light, neck supple, throat within normal limits Cardiovascular: Normal rate, regular rhythm and normal heart sounds.  No murmur heard. No BLE edema. Pulmonary/Chest: Effort normal and breath sounds normal. No respiratory distress. Abdominal: Soft.  There is no tenderness. Psychiatric: Patient has a normal mood and affect. behavior is normal. Judgment and thought content normal. Muscular skeletal: pain during palpation of right paraspinal muscle, no synovitis  PHQ2/9: Depression screen [facility] 2/9 11/13/2015 08/14/2015 07/13/2015 12/08/2014  Decreased Interest 0 0 0 0  Down, Depressed, Hopeless 3 0 3 0  PHQ - 2 Score 3 0 3 0  Altered sleeping 2 - 2 -  Tired, decreased energy 2 - 2 -  Change in appetite 0 - 0 -  Feeling bad or failure about yourself  0 - 1 -  Trouble concentrating 1 - 1 -  Moving slowly or fidgety/restless 0 - 0 -  Suicidal thoughts 0 - 0 -  PHQ-9 Score 8 - 9 -  Difficult doing work/chores - - Very difficult -     Fall Risk: Fall Risk  11/13/2015 08/14/2015 07/13/2015 02/09/2015 12/08/2014  Falls in the past year? No No No No No     Functional Status Survey: Is the patient deaf or have difficulty hearing?: No Does the patient have difficulty seeing, even when wearing glasses/contacts?: No Does the patient have difficulty concentrating, remembering, or making decisions?: No Does the patient have difficulty walking or climbing stairs?: No Does the patient have difficulty dressing or bathing?: No Does the patient have difficulty doing errands alone such as visiting a doctor's office or shopping?: No   Assessment & Plan  1. Controlled type  2 diabetes mellitus with stage 3 chronic kidney disease, without long-term  current use of insulin (HCC)  Start Metformin as prescribed, try to wean off Glyburide  2. Depression, major, recurrent, moderate (HCC)  - DULoxetine (CYMBALTA) 30 MG capsule; Take 1 capsule (30 mg total) by mouth daily.  Dispense: 270 capsule; Refill: 0 - clonazePAM (KLONOPIN) 0.5 MG tablet; Take 0.5-1 tablets (0.25-0.5 mg total) by mouth at bedtime as needed for anxiety.  Dispense: 30 tablet; Refill: 0  3. Paraspinal mass  Seeing Dr. Rudene Christians, having PT, mild improvement of pain, but still has severe episodes intermittently   4. Insomnia, persistent  We will change to CR since Ambien 10 mg is no longer working - zolpidem (AMBIEN CR) 12.5 MG CR tablet; Take 1 tablet (12.5 mg total) by mouth at bedtime as needed for sleep.  Dispense: 30 tablet; Refill: 0  5. Generalized anxiety disorder  Continue Clonazepam  6. Chronic back pain  She states Gabapentin 300 mg is not working and she wants to come off, we will wean her off medication  - gabapentin (NEURONTIN) 100 MG capsule; Take 1 capsule (100 mg total) by mouth at bedtime. And wean self off  Dispense: 30 capsule; Refill: 0 She also has a history of shingles and mass is located on the same area, may be post-herpetic neuralgia  7. Other specified hypothyroidism  Continue follow up with Dr. Ronnald Collum  8. Polymyalgia rheumatica ([place])  Keep follow up with Rheumatologist  9. Syncope, unspecified syncope type  Needs to call 911 next time, likely vaso-vagal and if recurrence referral to Neurologist   10. Essential hypertension  - diltiazem (DILACOR XR) 180 MG 24 hr capsule; Take 1 capsule (180 mg total) by mouth daily.  Dispense: 90 capsule; Refill: 1 - losartan-hydrochlorothiazide (HYZAAR) 50-12.5 MG tablet; Take 1 tablet by mouth daily.  Dispense: 90 tablet; Refill: 1  11. Hyperlipidemia  - pravastatin (PRAVACHOL) 40 MG tablet; Take 1 tablet (40 mg total) by mouth daily.  Dispense: 90 tablet; Refill: 1

## 2015-11-20 DIAGNOSIS — M546 Pain in thoracic spine: Secondary | ICD-10-CM | POA: Diagnosis not present

## 2015-11-20 DIAGNOSIS — G8929 Other chronic pain: Secondary | ICD-10-CM | POA: Diagnosis not present

## 2015-11-22 ENCOUNTER — Telehealth: Payer: Self-pay

## 2015-11-22 DIAGNOSIS — F411 Generalized anxiety disorder: Secondary | ICD-10-CM | POA: Diagnosis not present

## 2015-11-22 DIAGNOSIS — F331 Major depressive disorder, recurrent, moderate: Secondary | ICD-10-CM | POA: Diagnosis not present

## 2015-11-22 DIAGNOSIS — Z600 Problems of adjustment to life-cycle transitions: Secondary | ICD-10-CM | POA: Diagnosis not present

## 2015-11-22 NOTE — Telephone Encounter (Signed)
Patient states her [facility] never received the Ambien prescription and it usually has to be a 90 day supply. Could you please reprint this prescription and let the patient know when it is ready she will pick it up tomorrow. Thanks

## 2015-11-23 ENCOUNTER — Other Ambulatory Visit: Payer: Self-pay

## 2015-11-23 DIAGNOSIS — G4700 Insomnia, unspecified: Secondary | ICD-10-CM

## 2015-11-23 MED ORDER — ZOLPIDEM TARTRATE ER 12.5 MG PO TBCR: 12.5000 mg | EXTENDED_RELEASE_TABLET | Freq: Every evening | ORAL | Status: DC | PRN

## 2015-12-04 ENCOUNTER — Encounter: Payer: Medicare Other | Admitting: Obstetrics and Gynecology

## 2015-12-04 DIAGNOSIS — M353 Polymyalgia rheumatica: Secondary | ICD-10-CM | POA: Diagnosis not present

## 2015-12-04 DIAGNOSIS — L409 Psoriasis, unspecified: Secondary | ICD-10-CM | POA: Diagnosis not present

## 2015-12-04 DIAGNOSIS — M1990 Unspecified osteoarthritis, unspecified site: Secondary | ICD-10-CM | POA: Diagnosis not present

## 2015-12-04 DIAGNOSIS — L4050 Arthropathic psoriasis, unspecified: Secondary | ICD-10-CM | POA: Diagnosis not present

## 2015-12-06 DIAGNOSIS — M79672 Pain in left foot: Secondary | ICD-10-CM | POA: Diagnosis not present

## 2015-12-06 DIAGNOSIS — M898X9 Other specified disorders of bone, unspecified site: Secondary | ICD-10-CM | POA: Diagnosis not present

## 2015-12-06 DIAGNOSIS — E119 Type 2 diabetes mellitus without complications: Secondary | ICD-10-CM | POA: Diagnosis not present

## 2015-12-06 DIAGNOSIS — M659 Synovitis and tenosynovitis, unspecified: Secondary | ICD-10-CM | POA: Diagnosis not present

## 2015-12-07 DIAGNOSIS — M546 Pain in thoracic spine: Secondary | ICD-10-CM | POA: Diagnosis not present

## 2015-12-07 DIAGNOSIS — G8929 Other chronic pain: Secondary | ICD-10-CM | POA: Diagnosis not present

## 2015-12-09 ENCOUNTER — Other Ambulatory Visit: Payer: Self-pay | Admitting: Family Medicine

## 2015-12-11 DIAGNOSIS — G8929 Other chronic pain: Secondary | ICD-10-CM | POA: Diagnosis not present

## 2015-12-11 DIAGNOSIS — M546 Pain in thoracic spine: Secondary | ICD-10-CM | POA: Diagnosis not present

## 2015-12-25 ENCOUNTER — Telehealth: Payer: Self-pay | Admitting: Family Medicine

## 2015-12-26 ENCOUNTER — Encounter: Payer: Self-pay | Admitting: Family Medicine

## 2015-12-26 ENCOUNTER — Ambulatory Visit (INDEPENDENT_AMBULATORY_CARE_PROVIDER_SITE_OTHER): Payer: Medicare Other | Admitting: Family Medicine

## 2015-12-26 VITALS — BP 120/70 | HR 78 | Temp 97.6°F | Resp 18 | Ht 66.0 in | Wt 198.5 lb

## 2015-12-26 DIAGNOSIS — N183 Chronic kidney disease, stage 3 (moderate): Secondary | ICD-10-CM

## 2015-12-26 DIAGNOSIS — E1122 Type 2 diabetes mellitus with diabetic chronic kidney disease: Secondary | ICD-10-CM

## 2015-12-26 LAB — POCT GLYCOSYLATED HEMOGLOBIN (HGB A1C): Hemoglobin A1C: 6.9

## 2015-12-26 MED ORDER — GLIPIZIDE ER 5 MG PO TB24: 5.0000 mg | ORAL_TABLET | Freq: Every day | ORAL | 0 refills | Status: DC

## 2015-12-26 NOTE — Telephone Encounter (Signed)
Called and lvm for patient stating that Dr. Ancil Boozer wanted her to come in @ 2:20pm today due to her blood sugars being elevated and to bring in medications.  On the voice message I said the following :  "Dr. Ancil Boozer would like for you to come in at 2:20pm today due to blood sugar being high and to bring in all medications.  If you can make this appointment please call our office."

## 2015-12-26 NOTE — Progress Notes (Addendum)
Name: Christine Jensen   MRN: [id number]    DOB: 10-11-1950   Date:12/26/2015       Progress Note  Subjective  Chief Complaint  Chief Complaint  Patient presents with  . Chronic Kidney Disease  . Diabetes    numbers been running high.Has gotten to 324 recently    HPI  DMII: she is on Metformin EF 750 mg and going down on Prednisone to 2.5 mg daily and will be off next week. She was advised last visit to wean self off glyburide metformin once she was coming off prednisone, however over the past 2 weeks her glucose has gone high. Fasting in the 200's, and post-prandially in the 300's. She denies polyphagia, polydipsia or polyuria. She also denies blurred vision. hgbA1C today is still at goal at 6.9% but up from 6.3%. Discussed options and we will try Glipizide ER 5 mg and explained importance of not skipping meals, and taking with breakfast.    Patient Active Problem List   Diagnosis Date Noted  . Polymyalgia rheumatica ([place]) 11/13/2015  . Controlled type 2 diabetes mellitus with stage 3 chronic kidney disease, without long-term current use of insulin ([place]) 11/13/2015  . Osteoarthritis 08/01/2015  . Psoriatic arthritis ([place]) 08/01/2015  . Absence of sense of taste 02/09/2015  . Paraspinal mass 02/09/2015  . Hypertension, benign 02/09/2015  . Postherpetic neuralgia 02/09/2015  . IBS (irritable bowel syndrome) 12/08/2014  . Insomnia, persistent 12/08/2014  . Chronic back pain 12/08/2014  . Depression, major, recurrent, moderate ([place]) 11/24/2014  . Generalized anxiety disorder 11/24/2014  . Hypercholesteremia 09/15/2014  . Hypothyroid 09/15/2014    Past Surgical History:  Procedure Laterality Date  . ABDOMINAL PERINEAL BOWEL RESECTION    . APPENDECTOMY    . HERNIA REPAIR      Family History  Problem Relation Age of Onset  . Heart disease Mother   . Alzheimer's disease Mother   . Arthritis Father     Social History   Social History  . Marital status: Married    Spouse  name: N/A  . Number of children: N/A  . Years of education: N/A   Occupational History  . Not on file.   Social History Main Topics  . Smoking status: Never Smoker  . Smokeless tobacco: Never Used  . Alcohol use 0.0 oz/week     Comment: rarely  . Drug use: No  . Sexual activity: Yes    Partners: Male    Birth control/ protection: None   Other Topics Concern  . Not on file   Social History Narrative   ** Merged History Encounter **         Current Outpatient Prescriptions:  .  acetaminophen (TYLENOL) 500 MG tablet, Take 1 tablet (500 mg total) by mouth every 6 (six) hours as needed., Disp: 30 tablet, Rfl: 0 .  aspirin 81 MG tablet, Take 1 tablet by mouth daily., Disp: , Rfl:  .  clobetasol (TEMOVATE) 0.05 % external solution, , Disp: , Rfl:  .  Clobetasol Propionate 0.05 % shampoo, , Disp: , Rfl:  .  clonazePAM (KLONOPIN) 0.5 MG tablet, Take 0.5-1 tablets (0.25-0.5 mg total) by mouth at bedtime as needed for anxiety., Disp: 30 tablet, Rfl: 0 .  cyclobenzaprine (FLEXERIL) 10 MG tablet, TAKE 1 TABLET BY MOUTH UP TO THREE TIMES DAILY FOR MUSCLE SPASMS, Disp: 90 tablet, Rfl: 0 .  dicyclomine (BENTYL) 20 MG tablet, Take 20 mg by mouth 3 (three) times daily., Disp: , Rfl: 4 .  diltiazem (DILACOR XR) 180 MG 24 hr capsule, Take 1 capsule (180 mg total) by mouth daily., Disp: 90 capsule, Rfl: 1 .  DULoxetine (CYMBALTA) 30 MG capsule, Take 1 capsule (30 mg total) by mouth daily., Disp: 270 capsule, Rfl: 0 .  folic acid (FOLVITE) 1 MG tablet, Take 1 mg by mouth daily., Disp: , Rfl: 2 .  glipiZIDE (GLUCOTROL XL) 5 MG 24 hr tablet, Take 1 tablet (5 mg total) by mouth daily with breakfast., Disp: 30 tablet, Rfl: 0 .  HYDROcodone-acetaminophen (NORCO) 10-325 MG tablet, Take 1 tablet by mouth every 6 (six) hours as needed., Disp: 40 tablet, Rfl: 0 .  ibuprofen (ADVIL,MOTRIN) 800 MG tablet, Take 800 mg by mouth every 8 (eight) hours., Disp: , Rfl: 6 .  JUBLIA 10 % SOLN, , Disp: , Rfl:  .   levothyroxine (SYNTHROID, LEVOTHROID) 112 MCG tablet, Take 1 tablet (112 mcg total) by mouth daily before breakfast., Disp: 90 tablet, Rfl: 1 .  lidocaine (LIDODERM) 5 %, , Disp: , Rfl: 2 .  losartan-hydrochlorothiazide (HYZAAR) 50-12.5 MG tablet, Take 1 tablet by mouth daily., Disp: 90 tablet, Rfl: 1 .  Magnesium Gluconate 250 MG TABS, Take 1 tablet by mouth daily., Disp: , Rfl:  .  Melatonin 10 MG CAPS, Take 1 tablet by mouth daily., Disp: , Rfl:  .  metFORMIN (GLUCOPHAGE-XR) 750 MG 24 hr tablet, TAKE 1 TABLET BY MOUTH DAILY WITH BREAKFAST, Disp: 60 tablet, Rfl: 0 .  methotrexate (RHEUMATREX) 2.5 MG tablet, TAKE 8 TABLETS ONCE A WEEK, Disp: , Rfl: 2 .  mometasone (ELOCON) 0.1 % lotion, APPLY TO ITCHY EARS TWICE DAILY FOR 12 DAYS. MAY REPEAT AS NEEDED FOR ITCHY EARS, Disp: , Rfl: 10 .  pravastatin (PRAVACHOL) 40 MG tablet, Take 1 tablet (40 mg total) by mouth daily., Disp: 90 tablet, Rfl: 1 .  predniSONE (DELTASONE) 5 MG tablet, Take 2.5 mg by mouth daily with breakfast., Disp: , Rfl:  .  pyrithione zinc (HEAD AND SHOULDERS) 1 % shampoo, Apply topically daily as needed for itching., Disp: , Rfl:  .  zolpidem (AMBIEN CR) 12.5 MG CR tablet, Take 1 tablet (12.5 mg total) by mouth at bedtime as needed for sleep., Disp: 90 tablet, Rfl: 0  Allergies  Allergen Reactions  . Erythromycin Shortness Of Breath  . Macadamia Nut Oil Shortness Of Breath    Oily nuts   . Codeine   . Penicillins   . Sulfa Antibiotics Other (See Comments)    Joints become painful   . Aspartame Rash  . Other Rash    Shoes and Environmental     ROS  Ten systems reviewed and is negative except as mentioned in HPI   Objective  Vitals:   12/26/15 1425  BP: 120/70  Pulse: 78  Resp: 18  Temp: 97.6 F (36.4 C)  SpO2: 98%  Weight: 198 lb 8 oz (90 kg)  Height: 5\' 6"  (1.676 m)    Body mass index is 32.04 kg/m.  Physical Exam  Constitutional: Patient appears well-developed and well-nourished. Obese  No  distress.  HEENT: head atraumatic, normocephalic, pupils equal and reactive to light,  neck supple, throat within normal limits Cardiovascular: Normal rate, regular rhythm and normal heart sounds.  No murmur heard. No BLE edema. Pulmonary/Chest: Effort normal and breath sounds normal. No respiratory distress. Abdominal: Soft.  There is no tenderness. Psychiatric: Patient has a normal mood and affect. behavior is normal. Judgment and thought content normal. Muscular Skeletal: normal rom, she states joints  still aching, back tender to palpation worse on left mid back   Recent Results (from the past 2160 hour(s))  POCT HgB A1C     Status: None   Collection Time: 12/26/15  2:31 PM  Result Value Ref Range   Hemoglobin A1C 6.9      PHQ2/9: Depression screen [facility] 2/9 12/26/2015 11/13/2015 08/14/2015 07/13/2015 12/08/2014  Decreased Interest 2 0 0 0 0  Down, Depressed, Hopeless 2 3 0 3 0  PHQ - 2 Score 4 3 0 3 0  Altered sleeping 2 2 - 2 -  Tired, decreased energy 1 2 - 2 -  Change in appetite 1 0 - 0 -  Feeling bad or failure about yourself  1 0 - 1 -  Trouble concentrating 0 1 - 1 -  Moving slowly or fidgety/restless 0 0 - 0 -  Suicidal thoughts 0 0 - 0 -  PHQ-9 Score 9 8 - 9 -  Difficult doing work/chores Somewhat difficult - - Very difficult -     Fall Risk: Fall Risk  12/26/2015 11/13/2015 08/14/2015 07/13/2015 02/09/2015  Falls in the past year? No No No No No     Functional Status Survey: Is the patient deaf or have difficulty hearing?: No Does the patient have difficulty seeing, even when wearing glasses/contacts?: Yes Does the patient have difficulty concentrating, remembering, or making decisions?: No Does the patient have difficulty walking or climbing stairs?: No Does the patient have difficulty dressing or bathing?: No Does the patient have difficulty doing errands alone such as visiting a doctor's office or shopping?: No   Assessment & Plan  1. Controlled type 2 diabetes  mellitus with stage 3 chronic kidney disease, without long-term current use of insulin (HCC)  - POCT HgB A1C - glipiZIDE (GLUCOTROL XL) 5 MG 24 hr tablet; Take 1 tablet (5 mg total) by mouth daily with breakfast.  Dispense: 30 tablet; Refill: 0 Continue metformin, hopefully glucose will improve once off Prednisone

## 2015-12-26 NOTE — Telephone Encounter (Signed)
Please ask her to come in today.

## 2015-12-28 ENCOUNTER — Telehealth: Payer: Self-pay | Admitting: Family Medicine

## 2015-12-28 NOTE — Telephone Encounter (Signed)
Spoke to pt and [facility] yesterday [facility] [address]. Attempted to call pharmacy twice this morning and was on hold for more than 15 minutes each time.

## 2016-01-01 DIAGNOSIS — L4050 Arthropathic psoriasis, unspecified: Secondary | ICD-10-CM | POA: Diagnosis not present

## 2016-01-01 DIAGNOSIS — M353 Polymyalgia rheumatica: Secondary | ICD-10-CM | POA: Diagnosis not present

## 2016-01-01 DIAGNOSIS — Z79899 Other long term (current) drug therapy: Secondary | ICD-10-CM | POA: Diagnosis not present

## 2016-01-01 DIAGNOSIS — M1990 Unspecified osteoarthritis, unspecified site: Secondary | ICD-10-CM | POA: Diagnosis not present

## 2016-01-01 DIAGNOSIS — L409 Psoriasis, unspecified: Secondary | ICD-10-CM | POA: Diagnosis not present

## 2016-01-03 DIAGNOSIS — M659 Synovitis and tenosynovitis, unspecified: Secondary | ICD-10-CM | POA: Diagnosis not present

## 2016-01-15 ENCOUNTER — Ambulatory Visit (INDEPENDENT_AMBULATORY_CARE_PROVIDER_SITE_OTHER): Payer: Medicare Other | Admitting: Family Medicine

## 2016-01-15 ENCOUNTER — Encounter: Payer: Self-pay | Admitting: Family Medicine

## 2016-01-15 VITALS — BP 128/80 | HR 82 | Temp 97.4°F | Resp 18 | Ht 66.0 in | Wt 199.5 lb

## 2016-01-15 DIAGNOSIS — M353 Polymyalgia rheumatica: Secondary | ICD-10-CM

## 2016-01-15 DIAGNOSIS — R251 Tremor, unspecified: Secondary | ICD-10-CM | POA: Diagnosis not present

## 2016-01-15 DIAGNOSIS — N183 Chronic kidney disease, stage 3 (moderate): Secondary | ICD-10-CM | POA: Diagnosis not present

## 2016-01-15 DIAGNOSIS — R05 Cough: Secondary | ICD-10-CM | POA: Diagnosis not present

## 2016-01-15 DIAGNOSIS — J309 Allergic rhinitis, unspecified: Secondary | ICD-10-CM

## 2016-01-15 DIAGNOSIS — J302 Other seasonal allergic rhinitis: Secondary | ICD-10-CM | POA: Insufficient documentation

## 2016-01-15 DIAGNOSIS — J3089 Other allergic rhinitis: Secondary | ICD-10-CM

## 2016-01-15 DIAGNOSIS — I10 Essential (primary) hypertension: Secondary | ICD-10-CM

## 2016-01-15 DIAGNOSIS — J329 Chronic sinusitis, unspecified: Secondary | ICD-10-CM

## 2016-01-15 DIAGNOSIS — R059 Cough, unspecified: Secondary | ICD-10-CM

## 2016-01-15 DIAGNOSIS — D692 Other nonthrombocytopenic purpura: Secondary | ICD-10-CM | POA: Diagnosis not present

## 2016-01-15 DIAGNOSIS — E1122 Type 2 diabetes mellitus with diabetic chronic kidney disease: Secondary | ICD-10-CM

## 2016-01-15 DIAGNOSIS — R0982 Postnasal drip: Secondary | ICD-10-CM

## 2016-01-15 MED ORDER — METFORMIN HCL ER 750 MG PO TB24: 750.0000 mg | ORAL_TABLET | Freq: Every day | ORAL | 1 refills | Status: DC

## 2016-01-15 MED ORDER — DILTIAZEM HCL ER 180 MG PO CP24: 180.0000 mg | ORAL_CAPSULE | Freq: Every day | ORAL | 1 refills | Status: DC

## 2016-01-15 MED ORDER — FLUTICASONE PROPIONATE 50 MCG/ACT NA SUSP: 2.0000 | Freq: Every day | NASAL | 0 refills | Status: DC

## 2016-01-15 MED ORDER — LOSARTAN POTASSIUM-HCTZ 50-12.5 MG PO TABS: 1.0000 | ORAL_TABLET | Freq: Every day | ORAL | 1 refills | Status: DC

## 2016-01-15 NOTE — Progress Notes (Signed)
Name: Christine Jensen   MRN: [id number]    DOB: [birth year]   Date:01/15/2016       Progress Note  Subjective  Chief Complaint  Chief Complaint  Patient presents with  . Depression    2 month follow up  . Diabetes  . Cough    HPI  DMII: she is concerned because since switched from glyburide/metformin to Glipizide 5 mg XL and metformin 750 mg she has noticed that fasting glucose is about the same120's, but is higher than usual., and post-prandial level ( but right after she eats ) is in the 200's. She denies polyphagia, polyuria or polydpsia. Last hgbA1C was 8/2 a weeks ago. Advised to patient to send me a list of fasting sugar and one post-prandial reading in a few days to decide if we need to go up on Glipizide. She does not want to increase dose of metformin because of the size of the pill  Tremors: constant, present at rest but worse with intension. Occasionally has numbness on right hand. She denies change in balance. No family history of tremors. Not related to hypoglycemic episodes  Senile Purpura: she has noticed easy bruising on both arms  HTN: taking bp medication and no chest pain or palpitation.   Cough: she has noticed nasal congestion, post-nasal drainage and a cough for the past couple of weeks, no fever, no SOB or wheezing. She has a long history of allergies but is not on medications  PMR: she is off prednisone but still has stiff joints, worse on left hip.   Patient Active Problem List   Diagnosis Date Noted  . Perennial allergic rhinitis with seasonal variation 01/15/2016  . Senile purpura ([place]) 01/15/2016  . Polymyalgia rheumatica ([place]) 11/13/2015  . Controlled type 2 diabetes mellitus with stage 3 chronic kidney disease, without long-term current use of insulin ([place]) 11/13/2015  . Osteoarthritis 08/01/2015  . Psoriatic arthritis ([place]) 08/01/2015  . Absence of sense of taste 02/09/2015  . Paraspinal mass 02/09/2015  . Hypertension, benign 02/09/2015  .  Postherpetic neuralgia 02/09/2015  . IBS (irritable bowel syndrome) 12/08/2014  . Insomnia, persistent 12/08/2014  . Chronic back pain 12/08/2014  . Depression, major, recurrent, moderate ([place]) 11/24/2014  . Generalized anxiety disorder 11/24/2014  . Hypercholesteremia 09/15/2014  . Hypothyroid 09/15/2014    Past Surgical History:  Procedure Laterality Date  . ABDOMINAL PERINEAL BOWEL RESECTION    . APPENDECTOMY    . HERNIA REPAIR      Family History  Problem Relation Age of Onset  . Heart disease Mother   . Alzheimer's disease Mother   . Arthritis Father     Social History   Social History  . Marital status: Married    Spouse name: N/A  . Number of children: N/A  . Years of education: N/A   Occupational History  . Not on file.   Social History Main Topics  . Smoking status: Never Smoker  . Smokeless tobacco: Never Used  . Alcohol use 0.0 oz/week     Comment: rarely  . Drug use: No  . Sexual activity: Yes    Partners: Male    Birth control/ protection: None   Other Topics Concern  . Not on file   Social History Narrative   ** Merged History Encounter **         Current Outpatient Prescriptions:  .  aspirin 81 MG tablet, Take 1 tablet by mouth every other day. , Disp: , Rfl:  .  clobetasol (TEMOVATE)  0.05 % external solution, , Disp: , Rfl:  .  Clobetasol Propionate 0.05 % shampoo, , Disp: , Rfl:  .  cyclobenzaprine (FLEXERIL) 10 MG tablet, TAKE 1 TABLET BY MOUTH UP TO THREE TIMES DAILY FOR MUSCLE SPASMS, Disp: 90 tablet, Rfl: 0 .  dicyclomine (BENTYL) 20 MG tablet, Take 20 mg by mouth 3 (three) times daily., Disp: , Rfl: 4 .  diltiazem (DILACOR XR) 180 MG 24 hr capsule, Take 1 capsule (180 mg total) by mouth daily. For bp, Disp: 90 capsule, Rfl: 1 .  DULoxetine (CYMBALTA) 30 MG capsule, Take 1 capsule (30 mg total) by mouth daily., Disp: 270 capsule, Rfl: 0 .  fluticasone (FLONASE) 50 MCG/ACT nasal spray, Place 2 sprays into both nostrils daily., Disp: 16  g, Rfl: 0 .  folic acid (FOLVITE) 1 MG tablet, Take 1 mg by mouth daily., Disp: , Rfl: 2 .  glipiZIDE (GLUCOTROL XL) 5 MG 24 hr tablet, Take 1 tablet (5 mg total) by mouth daily with breakfast., Disp: 30 tablet, Rfl: 0 .  HYDROcodone-acetaminophen (NORCO) 10-325 MG tablet, Take 1 tablet by mouth every 6 (six) hours as needed., Disp: 40 tablet, Rfl: 0 .  JUBLIA 10 % SOLN, , Disp: , Rfl:  .  levothyroxine (SYNTHROID, LEVOTHROID) 112 MCG tablet, Take 1 tablet (112 mcg total) by mouth daily before breakfast., Disp: 90 tablet, Rfl: 1 .  lidocaine (LIDODERM) 5 %, , Disp: , Rfl: 2 .  losartan-hydrochlorothiazide (HYZAAR) 50-12.5 MG tablet, Take 1 tablet by mouth daily. For bp, Disp: 90 tablet, Rfl: 1 .  metFORMIN (GLUCOPHAGE-XR) 750 MG 24 hr tablet, Take 1 tablet (750 mg total) by mouth daily with breakfast., Disp: 90 tablet, Rfl: 1 .  methotrexate (RHEUMATREX) 2.5 MG tablet, TAKE 8 TABLETS ONCE A WEEK, Disp: , Rfl: 2 .  mometasone (ELOCON) 0.1 % lotion, APPLY TO ITCHY EARS TWICE DAILY FOR 12 DAYS. MAY REPEAT AS NEEDED FOR ITCHY EARS, Disp: , Rfl: 10 .  pravastatin (PRAVACHOL) 40 MG tablet, Take 1 tablet (40 mg total) by mouth daily., Disp: 90 tablet, Rfl: 1 .  pyrithione zinc (HEAD AND SHOULDERS) 1 % shampoo, Apply topically daily as needed for itching., Disp: , Rfl:  .  zolpidem (AMBIEN CR) 12.5 MG CR tablet, Take 1 tablet (12.5 mg total) by mouth at bedtime as needed for sleep., Disp: 90 tablet, Rfl: 0  Allergies  Allergen Reactions  . Erythromycin Shortness Of Breath  . Macadamia Nut Oil Shortness Of Breath    Oily nuts   . Codeine   . Penicillins   . Sulfa Antibiotics Other (See Comments)    Joints become painful   . Aspartame Rash  . Other Rash    Shoes and Environmental     ROS  Constitutional: Negative for fever or weight change.  Respiratory: Positive  for cough no shortness of breath.   Cardiovascular: Negative for chest pain or palpitations.  Gastrointestinal: Negative for  abdominal pain, no bowel changes.  Musculoskeletal: Negative for gait problem or joint swelling.  Skin: Negative for rash.  Neurological: Negative for dizziness or headache.  No other specific complaints in a complete review of systems (except as listed in HPI above).  Objective  Vitals:   01/15/16 1428  BP: 128/80  Pulse: 82  Resp: 18  Temp: 97.4 F (36.3 C)  SpO2: 96%  Weight: 199 lb 8 oz (90.5 kg)  Height: 5\' 6"  (1.676 m)    Body mass index is 32.2 kg/m.  Physical Exam  Constitutional: Patient appears well-developed and well-nourished. Obese No distress.  HEENT: head atraumatic, normocephalic, pupils equal and reactive to light,  neck supple, throat within normal limits Cardiovascular: Normal rate, regular rhythm and normal heart sounds.  No murmur heard. No BLE edema. Pulmonary/Chest: Effort normal and breath sounds normal. No respiratory distress. Abdominal: Soft.  There is no tenderness. Psychiatric: Patient has a normal mood and affect. behavior is normal. Judgment and thought content normal. Neurological : tremors during rest and intension  Skin: senile purpura both arms  Recent Results (from the past 2160 hour(s))  POCT HgB A1C     Status: None   Collection Time: 12/26/15  2:31 PM  Result Value Ref Range   Hemoglobin A1C 6.9       PHQ2/9: Depression screen [facility] 2/9 01/15/2016 12/26/2015 11/13/2015 08/14/2015 07/13/2015  Decreased Interest 1 2 0 0 0  Down, Depressed, Hopeless 1 2 3  0 3  PHQ - 2 Score 2 4 3  0 3  Altered sleeping 3 2 2  - 2  Tired, decreased energy 1 1 2  - 2  Change in appetite 3 1 0 - 0  Feeling bad or failure about yourself  1 1 0 - 1  Trouble concentrating 1 0 1 - 1  Moving slowly or fidgety/restless 1 0 0 - 0  Suicidal thoughts 0 0 0 - 0  PHQ-9 Score 12 9 8  - 9  Difficult doing work/chores - Somewhat difficult - - Very difficult     Fall Risk: Fall Risk  01/15/2016 12/26/2015 11/13/2015 08/14/2015 07/13/2015  Falls in the past year? No No No  No No      Assessment & Plan  1. Controlled type 2 diabetes mellitus with stage 3 chronic kidney disease, without long-term current use of insulin (HCC)  - metFORMIN (GLUCOPHAGE-XR) 750 MG 24 hr tablet; Take 1 tablet (750 mg total) by mouth daily with breakfast.  Dispense: 90 tablet; Refill: 1  2. Polymyalgia rheumatica (HCC)  Off Prednisone  3. Essential hypertension  - diltiazem (DILACOR XR) 180 MG 24 hr capsule; Take 1 capsule (180 mg total) by mouth daily. For bp  Dispense: 90 capsule; Refill: 1 - losartan-hydrochlorothiazide (HYZAAR) 50-12.5 MG tablet; Take 1 tablet by mouth daily. For bp  Dispense: 90 tablet; Refill: 1  4. Cough  Try nasal steroid  5. Post-nasal drainage  We will try nasal steroids but advised her to call for antibiotics if not better   6. Perennial allergic rhinitis with seasonal variation  - fluticasone (FLONASE) 50 MCG/ACT nasal spray; Place 2 sprays into both nostrils daily.  Dispense: 16 g; Refill: 0  7. Tremors of nervous system  - Ambulatory referral to Neurology  8. Senile purpura ([place])

## 2016-01-15 NOTE — Patient Instructions (Signed)
Check glucose before breakfast : needs to be between 80-140 Two hours after a meal : needs to be below 180

## 2016-01-23 ENCOUNTER — Other Ambulatory Visit: Payer: Self-pay

## 2016-01-23 DIAGNOSIS — N183 Chronic kidney disease, stage 3 unspecified: Secondary | ICD-10-CM

## 2016-01-23 DIAGNOSIS — E1122 Type 2 diabetes mellitus with diabetic chronic kidney disease: Secondary | ICD-10-CM

## 2016-01-23 MED ORDER — GLIPIZIDE ER 5 MG PO TB24: 5.0000 mg | ORAL_TABLET | Freq: Every day | ORAL | 0 refills | Status: DC

## 2016-01-23 MED ORDER — GLUCOSE BLOOD VI STRP: ORAL_STRIP | 12 refills | Status: DC

## 2016-01-23 NOTE — Progress Notes (Unsigned)
Test strips touch ultra

## 2016-01-24 ENCOUNTER — Other Ambulatory Visit: Payer: Self-pay | Admitting: Family Medicine

## 2016-01-24 DIAGNOSIS — E1122 Type 2 diabetes mellitus with diabetic chronic kidney disease: Secondary | ICD-10-CM

## 2016-01-24 DIAGNOSIS — F411 Generalized anxiety disorder: Secondary | ICD-10-CM | POA: Diagnosis not present

## 2016-01-24 DIAGNOSIS — N183 Chronic kidney disease, stage 3 unspecified: Secondary | ICD-10-CM

## 2016-01-24 DIAGNOSIS — F331 Major depressive disorder, recurrent, moderate: Secondary | ICD-10-CM | POA: Diagnosis not present

## 2016-01-24 DIAGNOSIS — Z600 Problems of adjustment to life-cycle transitions: Secondary | ICD-10-CM | POA: Diagnosis not present

## 2016-02-07 DIAGNOSIS — Z600 Problems of adjustment to life-cycle transitions: Secondary | ICD-10-CM | POA: Diagnosis not present

## 2016-02-07 DIAGNOSIS — F331 Major depressive disorder, recurrent, moderate: Secondary | ICD-10-CM | POA: Diagnosis not present

## 2016-02-07 DIAGNOSIS — F411 Generalized anxiety disorder: Secondary | ICD-10-CM | POA: Diagnosis not present

## 2016-02-16 ENCOUNTER — Other Ambulatory Visit: Payer: Self-pay | Admitting: Family Medicine

## 2016-02-18 ENCOUNTER — Other Ambulatory Visit: Payer: Self-pay

## 2016-02-18 DIAGNOSIS — E785 Hyperlipidemia, unspecified: Secondary | ICD-10-CM

## 2016-02-18 MED ORDER — PRAVASTATIN SODIUM 40 MG PO TABS: 40.0000 mg | ORAL_TABLET | Freq: Every day | ORAL | 1 refills | Status: DC

## 2016-02-18 NOTE — Telephone Encounter (Signed)
Did she give you this prescription?

## 2016-02-18 NOTE — Telephone Encounter (Signed)
Patient requesting refill of Pravastatin to [facility].

## 2016-02-19 ENCOUNTER — Other Ambulatory Visit: Payer: Self-pay

## 2016-02-19 DIAGNOSIS — J302 Other seasonal allergic rhinitis: Secondary | ICD-10-CM

## 2016-02-19 DIAGNOSIS — E1122 Type 2 diabetes mellitus with diabetic chronic kidney disease: Secondary | ICD-10-CM

## 2016-02-19 DIAGNOSIS — E785 Hyperlipidemia, unspecified: Secondary | ICD-10-CM

## 2016-02-19 DIAGNOSIS — N183 Chronic kidney disease, stage 3 (moderate): Secondary | ICD-10-CM

## 2016-02-19 DIAGNOSIS — J3089 Other allergic rhinitis: Secondary | ICD-10-CM

## 2016-02-19 MED ORDER — PRAVASTATIN SODIUM 40 MG PO TABS: 40.0000 mg | ORAL_TABLET | Freq: Every day | ORAL | 1 refills | Status: DC

## 2016-02-19 MED ORDER — FLUTICASONE PROPIONATE 50 MCG/ACT NA SUSP: 2.0000 | Freq: Every day | NASAL | 0 refills | Status: DC

## 2016-02-19 MED ORDER — GLIPIZIDE ER 5 MG PO TB24: 5.0000 mg | ORAL_TABLET | Freq: Every morning | ORAL | 1 refills | Status: DC

## 2016-02-19 NOTE — Telephone Encounter (Signed)
Patient requesting refill of Fluticasone and Glipizide ER. Please print prescriptions.  The pravastatin was accidentally printed on the white paper yesterday and needs to be printed on scription paper.

## 2016-02-20 ENCOUNTER — Other Ambulatory Visit: Payer: Self-pay

## 2016-02-20 DIAGNOSIS — E1122 Type 2 diabetes mellitus with diabetic chronic kidney disease: Secondary | ICD-10-CM

## 2016-02-20 DIAGNOSIS — J3089 Other allergic rhinitis: Secondary | ICD-10-CM

## 2016-02-20 DIAGNOSIS — J302 Other seasonal allergic rhinitis: Secondary | ICD-10-CM

## 2016-02-20 DIAGNOSIS — E785 Hyperlipidemia, unspecified: Secondary | ICD-10-CM

## 2016-02-20 DIAGNOSIS — N183 Chronic kidney disease, stage 3 (moderate): Secondary | ICD-10-CM

## 2016-02-20 MED ORDER — PRAVASTATIN SODIUM 40 MG PO TABS: 40.0000 mg | ORAL_TABLET | Freq: Every day | ORAL | 1 refills | Status: DC

## 2016-02-20 MED ORDER — GLIPIZIDE ER 5 MG PO TB24: 5.0000 mg | ORAL_TABLET | Freq: Every morning | ORAL | 1 refills | Status: DC

## 2016-02-20 MED ORDER — FLUTICASONE PROPIONATE 50 MCG/ACT NA SUSP: 2.0000 | Freq: Every day | NASAL | 0 refills | Status: DC

## 2016-02-26 ENCOUNTER — Telehealth: Payer: Self-pay | Admitting: Family Medicine

## 2016-02-26 ENCOUNTER — Other Ambulatory Visit: Payer: Self-pay | Admitting: Family Medicine

## 2016-02-26 DIAGNOSIS — G4700 Insomnia, unspecified: Secondary | ICD-10-CM

## 2016-02-26 MED ORDER — ZOLPIDEM TARTRATE ER 12.5 MG PO TBCR: 12.5000 mg | EXTENDED_RELEASE_TABLET | Freq: Every evening | ORAL | 0 refills | Status: DC | PRN

## 2016-02-26 NOTE — Telephone Encounter (Signed)
Faxed to pharmacy and called and informed patient

## 2016-02-26 NOTE — Telephone Encounter (Signed)
Requesting a prescription for Zolpidem Tartrate ER 12.5mg  (90 day supply). Asking that you please send [facility] [address] Quintella Baton she only have a week worth of pills left.

## 2016-02-28 DIAGNOSIS — Z600 Problems of adjustment to life-cycle transitions: Secondary | ICD-10-CM | POA: Diagnosis not present

## 2016-02-28 DIAGNOSIS — F331 Major depressive disorder, recurrent, moderate: Secondary | ICD-10-CM | POA: Diagnosis not present

## 2016-02-28 DIAGNOSIS — F411 Generalized anxiety disorder: Secondary | ICD-10-CM | POA: Diagnosis not present

## 2016-03-05 ENCOUNTER — Other Ambulatory Visit: Payer: Self-pay

## 2016-03-05 DIAGNOSIS — J302 Other seasonal allergic rhinitis: Secondary | ICD-10-CM

## 2016-03-05 DIAGNOSIS — N183 Chronic kidney disease, stage 3 (moderate): Principal | ICD-10-CM

## 2016-03-05 DIAGNOSIS — E1122 Type 2 diabetes mellitus with diabetic chronic kidney disease: Secondary | ICD-10-CM

## 2016-03-05 DIAGNOSIS — J3089 Other allergic rhinitis: Secondary | ICD-10-CM

## 2016-03-05 MED ORDER — FLUTICASONE PROPIONATE 50 MCG/ACT NA SUSP: 2.0000 | Freq: Every day | NASAL | 0 refills | Status: DC

## 2016-03-05 MED ORDER — GLIPIZIDE ER 5 MG PO TB24: 5.0000 mg | ORAL_TABLET | Freq: Every morning | ORAL | 1 refills | Status: DC

## 2016-03-05 NOTE — Telephone Encounter (Signed)
Patient requesting refill of Flonase and [facility].

## 2016-03-06 DIAGNOSIS — E119 Type 2 diabetes mellitus without complications: Secondary | ICD-10-CM | POA: Diagnosis not present

## 2016-03-06 DIAGNOSIS — F5101 Primary insomnia: Secondary | ICD-10-CM | POA: Diagnosis not present

## 2016-03-06 DIAGNOSIS — I10 Essential (primary) hypertension: Secondary | ICD-10-CM | POA: Diagnosis not present

## 2016-03-06 DIAGNOSIS — E785 Hyperlipidemia, unspecified: Secondary | ICD-10-CM | POA: Diagnosis not present

## 2016-03-06 DIAGNOSIS — E049 Nontoxic goiter, unspecified: Secondary | ICD-10-CM | POA: Diagnosis not present

## 2016-03-06 DIAGNOSIS — K229 Disease of esophagus, unspecified: Secondary | ICD-10-CM | POA: Diagnosis not present

## 2016-03-06 DIAGNOSIS — E890 Postprocedural hypothyroidism: Secondary | ICD-10-CM | POA: Diagnosis not present

## 2016-03-06 LAB — TSH: TSH: 0.47 u[IU]/mL (ref 0.41–5.90)

## 2016-03-07 NOTE — Telephone Encounter (Signed)
ERRENOUS °

## 2016-03-13 ENCOUNTER — Ambulatory Visit (INDEPENDENT_AMBULATORY_CARE_PROVIDER_SITE_OTHER): Payer: Medicare Other

## 2016-03-13 ENCOUNTER — Encounter: Payer: Self-pay | Admitting: Family Medicine

## 2016-03-13 DIAGNOSIS — K229 Disease of esophagus, unspecified: Secondary | ICD-10-CM | POA: Diagnosis not present

## 2016-03-13 DIAGNOSIS — E785 Hyperlipidemia, unspecified: Secondary | ICD-10-CM | POA: Diagnosis not present

## 2016-03-13 DIAGNOSIS — F5101 Primary insomnia: Secondary | ICD-10-CM | POA: Diagnosis not present

## 2016-03-13 DIAGNOSIS — E890 Postprocedural hypothyroidism: Secondary | ICD-10-CM | POA: Diagnosis not present

## 2016-03-13 DIAGNOSIS — Z23 Encounter for immunization: Secondary | ICD-10-CM

## 2016-03-13 DIAGNOSIS — I10 Essential (primary) hypertension: Secondary | ICD-10-CM | POA: Diagnosis not present

## 2016-03-13 DIAGNOSIS — E049 Nontoxic goiter, unspecified: Secondary | ICD-10-CM | POA: Diagnosis not present

## 2016-03-13 DIAGNOSIS — E119 Type 2 diabetes mellitus without complications: Secondary | ICD-10-CM | POA: Diagnosis not present

## 2016-03-14 ENCOUNTER — Encounter: Payer: Self-pay | Admitting: Family Medicine

## 2016-03-17 ENCOUNTER — Encounter: Payer: Self-pay | Admitting: Family Medicine

## 2016-03-20 ENCOUNTER — Telehealth: Payer: Self-pay | Admitting: Family Medicine

## 2016-03-20 DIAGNOSIS — F411 Generalized anxiety disorder: Secondary | ICD-10-CM | POA: Diagnosis not present

## 2016-03-20 DIAGNOSIS — F331 Major depressive disorder, recurrent, moderate: Secondary | ICD-10-CM | POA: Diagnosis not present

## 2016-03-20 DIAGNOSIS — Z600 Problems of adjustment to life-cycle transitions: Secondary | ICD-10-CM | POA: Diagnosis not present

## 2016-03-20 NOTE — Telephone Encounter (Signed)
Therapist Christine Jensen wanted to give Dr. Ancil Jensen an update on maybe increasing her anti-depressants.  Please call Christine Jensen to discuss.

## 2016-03-23 NOTE — Telephone Encounter (Signed)
Ask her to come in sooner to discuss medication adjustment. Thank you

## 2016-03-26 ENCOUNTER — Other Ambulatory Visit: Payer: Self-pay | Admitting: Neurology

## 2016-03-26 DIAGNOSIS — G252 Other specified forms of tremor: Secondary | ICD-10-CM

## 2016-04-03 DIAGNOSIS — M353 Polymyalgia rheumatica: Secondary | ICD-10-CM | POA: Diagnosis not present

## 2016-04-03 DIAGNOSIS — M25552 Pain in left hip: Secondary | ICD-10-CM | POA: Diagnosis not present

## 2016-04-03 DIAGNOSIS — M1990 Unspecified osteoarthritis, unspecified site: Secondary | ICD-10-CM | POA: Diagnosis not present

## 2016-04-03 DIAGNOSIS — L4050 Arthropathic psoriasis, unspecified: Secondary | ICD-10-CM | POA: Diagnosis not present

## 2016-04-03 DIAGNOSIS — L409 Psoriasis, unspecified: Secondary | ICD-10-CM | POA: Diagnosis not present

## 2016-04-03 DIAGNOSIS — Z79899 Other long term (current) drug therapy: Secondary | ICD-10-CM | POA: Diagnosis not present

## 2016-04-15 ENCOUNTER — Ambulatory Visit
Admission: RE | Admit: 2016-04-15 | Discharge: 2016-04-15 | Disposition: A | Discharge status: Home / Self Care | Payer: Medicare Other | Source: Ambulatory Visit | Attending: Neurology | Admitting: Neurology

## 2016-04-15 DIAGNOSIS — G252 Other specified forms of tremor: Secondary | ICD-10-CM | POA: Insufficient documentation

## 2016-04-15 DIAGNOSIS — R259 Unspecified abnormal involuntary movements: Secondary | ICD-10-CM | POA: Diagnosis not present

## 2016-04-21 ENCOUNTER — Other Ambulatory Visit: Payer: Self-pay | Admitting: Family Medicine

## 2016-04-21 DIAGNOSIS — E1122 Type 2 diabetes mellitus with diabetic chronic kidney disease: Secondary | ICD-10-CM

## 2016-04-21 DIAGNOSIS — Z600 Problems of adjustment to life-cycle transitions: Secondary | ICD-10-CM | POA: Diagnosis not present

## 2016-04-21 DIAGNOSIS — N183 Chronic kidney disease, stage 3 (moderate): Principal | ICD-10-CM

## 2016-04-21 DIAGNOSIS — F331 Major depressive disorder, recurrent, moderate: Secondary | ICD-10-CM | POA: Diagnosis not present

## 2016-04-21 DIAGNOSIS — F411 Generalized anxiety disorder: Secondary | ICD-10-CM | POA: Diagnosis not present

## 2016-04-21 NOTE — Telephone Encounter (Signed)
[facility]  

## 2016-04-22 ENCOUNTER — Encounter: Payer: Self-pay | Admitting: Family Medicine

## 2016-04-22 ENCOUNTER — Ambulatory Visit (INDEPENDENT_AMBULATORY_CARE_PROVIDER_SITE_OTHER): Payer: Medicare Other | Admitting: Family Medicine

## 2016-04-22 VITALS — BP 122/62 | HR 78 | Temp 98.3°F | Ht 66.0 in | Wt 202.0 lb

## 2016-04-22 DIAGNOSIS — E785 Hyperlipidemia, unspecified: Secondary | ICD-10-CM | POA: Diagnosis not present

## 2016-04-22 DIAGNOSIS — L4050 Arthropathic psoriasis, unspecified: Secondary | ICD-10-CM

## 2016-04-22 DIAGNOSIS — D692 Other nonthrombocytopenic purpura: Secondary | ICD-10-CM

## 2016-04-22 DIAGNOSIS — R251 Tremor, unspecified: Secondary | ICD-10-CM

## 2016-04-22 DIAGNOSIS — E1122 Type 2 diabetes mellitus with diabetic chronic kidney disease: Secondary | ICD-10-CM

## 2016-04-22 DIAGNOSIS — Z79899 Other long term (current) drug therapy: Secondary | ICD-10-CM

## 2016-04-22 DIAGNOSIS — I10 Essential (primary) hypertension: Secondary | ICD-10-CM

## 2016-04-22 DIAGNOSIS — Z23 Encounter for immunization: Secondary | ICD-10-CM

## 2016-04-22 DIAGNOSIS — N183 Chronic kidney disease, stage 3 (moderate): Secondary | ICD-10-CM | POA: Diagnosis not present

## 2016-04-22 DIAGNOSIS — M353 Polymyalgia rheumatica: Secondary | ICD-10-CM | POA: Diagnosis not present

## 2016-04-22 DIAGNOSIS — F331 Major depressive disorder, recurrent, moderate: Secondary | ICD-10-CM

## 2016-04-22 LAB — POCT GLYCOSYLATED HEMOGLOBIN (HGB A1C): Hemoglobin A1C: 7.3

## 2016-04-22 MED ORDER — DILTIAZEM HCL ER 180 MG PO CP24: 180.0000 mg | ORAL_CAPSULE | Freq: Every day | ORAL | 1 refills | Status: DC

## 2016-04-22 MED ORDER — ESCITALOPRAM OXALATE 10 MG PO TABS: 10.0000 mg | ORAL_TABLET | Freq: Every day | ORAL | 0 refills | Status: DC

## 2016-04-22 NOTE — Progress Notes (Signed)
Name: Christine Jensen   MRN: [id number]    DOB: Oct 08, 1950   Date:04/22/2016       Progress Note  Subjective  Chief Complaint  Chief Complaint  Patient presents with  . Follow-up  . Diabetes    Blood sugars since taking prednisone again. A lot 180-200's.  . Tremors    Just had neurology appointment and MRI last Tuesday.     HPI  DMII: she is on Glipizide 5 mg XL and metformin 750 mg and last hgbA1C was 6.9%, however she is back on prednisone for PMR and glucose is now going up again. Fasting glucose is up in the 150's, post-prandially is around 170's-180's. Occasionally up to 300.  hgbA1C is still at goal at 7.3%. Explained the importance of resuming diet, glucose is likely high because she resumed prednisone. She denies polyphagia, polyuria or polydipsia. She has been going to water aerobic twice weekly, she tries to walk when not in severe pain.   Tremors: constant, present at rest but worse with intension. Occasionally has numbness on right hand. She denies change in balance. No family history of tremors. Not related to hypoglycemic episodes. She was seen by Dr. Manuella Ghazi, per patient she does not have Parkinson  Senile Purpura: she has noticed easy bruising on both arms  HTN: taking bp medication and no chest pain or palpitation. She denies constipation from calcium channel blocker.  PMR: she is back on prednisone, 2.5 mg daily unable to walk without medication  Insomnia: she is on high dose Ambien, discussed that is above the recommended dose of Ambien for females but she states she can't sleep without it.   Depression: she continues to struggle, sees therapist, unable to tolerate higher dose of Cymbalta   Patient Active Problem List   Diagnosis Date Noted  . Perennial allergic rhinitis with seasonal variation 01/15/2016  . Senile purpura ([place]) 01/15/2016  . Polymyalgia rheumatica ([place]) 11/13/2015  . Controlled type 2 diabetes mellitus with stage 3 chronic kidney disease,  without long-term current use of insulin ([place]) 11/13/2015  . Osteoarthritis 08/01/2015  . Psoriatic arthritis ([place]) 08/01/2015  . Absence of sense of taste 02/09/2015  . Paraspinal mass 02/09/2015  . Hypertension, benign 02/09/2015  . Postherpetic neuralgia 02/09/2015  . IBS (irritable bowel syndrome) 12/08/2014  . Insomnia, persistent 12/08/2014  . Chronic back pain 12/08/2014  . Depression, major, recurrent, moderate ([place]) 11/24/2014  . Generalized anxiety disorder 11/24/2014  . Hypercholesteremia 09/15/2014  . Hypothyroid 09/15/2014    Past Surgical History:  Procedure Laterality Date  . ABDOMINAL PERINEAL BOWEL RESECTION    . APPENDECTOMY    . HERNIA REPAIR    . THYROIDECTOMY Left    Dr. Ronnald Collum on 03/06/16    Family History  Problem Relation Age of Onset  . Arthritis Father   . Heart disease Mother   . Alzheimer's disease Mother     Social History   Social History  . Marital status: Married    Spouse name: N/A  . Number of children: N/A  . Years of education: N/A   Occupational History  . Not on file.   Social History Main Topics  . Smoking status: Never Smoker  . Smokeless tobacco: Never Used  . Alcohol use 0.0 oz/week     Comment: rarely  . Drug use: No  . Sexual activity: Yes    Partners: Male    Birth control/ protection: None   Other Topics Concern  . Not on file   Social History  Narrative   ** Merged History Encounter **         Current Outpatient Prescriptions:  .  aspirin 81 MG tablet, Take 1 tablet by mouth every other day. , Disp: , Rfl:  .  clobetasol (TEMOVATE) 0.05 % external solution, , Disp: , Rfl:  .  Clobetasol Propionate 0.05 % shampoo, , Disp: , Rfl:  .  cyclobenzaprine (FLEXERIL) 10 MG tablet, TAKE 1 TABLET BY MOUTH UP TO THREE TIMES DAILY FOR MUSCLE SPASMS, Disp: 90 tablet, Rfl: 0 .  dicyclomine (BENTYL) 20 MG tablet, Take 20 mg by mouth 3 (three) times daily., Disp: , Rfl: 4 .  fluticasone (FLONASE) 50 MCG/ACT nasal  spray, Place 2 sprays into both nostrils daily., Disp: 16 g, Rfl: 0 .  folic acid (FOLVITE) 1 MG tablet, Take 1 mg by mouth daily., Disp: , Rfl: 2 .  glipiZIDE (GLUCOTROL XL) 5 MG 24 hr tablet, TAKE TAKE 1 TABLET BY MOUTH EVERY DAY WITH BREAKFAST, Disp: 90 tablet, Rfl: 0 .  glucose blood test strip, Use as instructed, Disp: 100 each, Rfl: 12 .  HYDROcodone-acetaminophen (NORCO) 10-325 MG tablet, Take 1 tablet by mouth every 6 (six) hours as needed., Disp: 40 tablet, Rfl: 0 .  JUBLIA 10 % SOLN, , Disp: , Rfl:  .  levothyroxine (SYNTHROID, LEVOTHROID) 112 MCG tablet, Take 1 tablet (112 mcg total) by mouth daily before breakfast., Disp: 90 tablet, Rfl: 1 .  lidocaine (LIDODERM) 5 %, , Disp: , Rfl: 2 .  losartan-hydrochlorothiazide (HYZAAR) 50-12.5 MG tablet, Take 1 tablet by mouth daily. For bp, Disp: 90 tablet, Rfl: 1 .  metFORMIN (GLUCOPHAGE-XR) 750 MG 24 hr tablet, Take 1 tablet (750 mg total) by mouth daily with breakfast., Disp: 90 tablet, Rfl: 1 .  methotrexate (RHEUMATREX) 2.5 MG tablet, TAKE 8 TABLETS ONCE A WEEK, Disp: , Rfl: 2 .  pravastatin (PRAVACHOL) 40 MG tablet, Take 1 tablet (40 mg total) by mouth daily., Disp: 90 tablet, Rfl: 1 .  predniSONE (DELTASONE) 2.5 MG tablet, Take 2.5 mg by mouth every morning., Disp: , Rfl:  .  pyrithione zinc (HEAD AND SHOULDERS) 1 % shampoo, Apply topically daily as needed for itching., Disp: , Rfl:  .  zolpidem (AMBIEN CR) 12.5 MG CR tablet, Take 1 tablet (12.5 mg total) by mouth at bedtime as needed for sleep., Disp: 90 tablet, Rfl: 0 .  diltiazem (DILACOR XR) 180 MG 24 hr capsule, Take 1 capsule (180 mg total) by mouth daily. For bp, Disp: 90 capsule, Rfl: 1 .  mometasone (ELOCON) 0.1 % lotion, APPLY TO ITCHY EARS TWICE DAILY FOR 12 DAYS. MAY REPEAT AS NEEDED FOR ITCHY EARS, Disp: , Rfl: 10  Allergies  Allergen Reactions  . Erythromycin Shortness Of Breath  . Macadamia Nut Oil Shortness Of Breath    Oily nuts   . Codeine   . Penicillins   . Sulfa  Antibiotics Other (See Comments)    Joints become painful   . Aspartame Rash  . Other Rash    Shoes and Environmental     ROS   Constitutional: Negative for fever or weight change.  Respiratory: Negative for cough and shortness of breath.   Cardiovascular: Negative for chest pain or palpitations.  Gastrointestinal: Negative for abdominal pain, no bowel changes.  Musculoskeletal: Negative for gait problem or joint swelling.  Skin: Negative for rash.  Neurological: Negative for dizziness or headache.  No other specific complaints in a complete review of systems (except as listed in HPI above).  Objective  Vitals:   04/22/16 1350  BP: 122/62  Pulse: 78  Temp: 98.3 F (36.8 C)  SpO2: 98%  Weight: 202 lb (91.6 kg)  Height: 5\' 6"  (1.676 m)    Body mass index is 32.6 kg/m.  Physical Exam  Constitutional: Patient appears well-developed and well-nourished. Obese No distress.  HEENT: head atraumatic, normocephalic, pupils equal and reactive to light,  neck supple, throat within normal limits Cardiovascular: Normal rate, regular rhythm and normal heart sounds.  No murmur heard. No BLE edema. Pulmonary/Chest: Effort normal and breath sounds normal. No respiratory distress. Abdominal: Soft.  There is no tenderness. Psychiatric: Patient has a normal mood and affect. behavior is normal. Judgment and thought content normal.  Recent Results (from the past 2160 hour(s))  TSH     Status: None   Collection Time: 03/06/16 12:00 AM  Result Value Ref Range   TSH 0.47 0.41 - 5.90 uIU/mL    Comment: Dr. Sheryle Spray labs  POCT HgB A1C     Status: None   Collection Time: 04/22/16  2:02 PM  Result Value Ref Range   Hemoglobin A1C 7.3     Diabetic Foot Exam: Diabetic Foot Exam - Simple   Simple Foot Form Diabetic Foot exam was performed with the following findings:  Yes 04/22/2016  2:43 PM  Visual Inspection No deformities, no ulcerations, no other skin breakdown bilaterally:   Yes Sensation Testing Intact to touch and monofilament testing bilaterally:  Yes Pulse Check Posterior Tibialis and Dorsalis pulse intact bilaterally:  Yes Comments      PHQ2/9: Depression screen [facility] 2/9 04/22/2016 01/15/2016 12/26/2015 11/13/2015 08/14/2015  Decreased Interest 3 1 2  0 0  Down, Depressed, Hopeless 3 1 2 3  0  PHQ - 2 Score 6 2 4 3  0  Altered sleeping 3 3 2 2  -  Tired, decreased energy 0 1 1 2  -  Change in appetite 3 3 1  0 -  Feeling bad or failure about yourself  3 1 1  0 -  Trouble concentrating 0 1 0 1 -  Moving slowly or fidgety/restless 0 1 0 0 -  Suicidal thoughts 0 0 0 0 -  PHQ-9 Score 15 12 9 8  -  Difficult doing work/chores Somewhat difficult - Somewhat difficult - -    Fall Risk: Fall Risk  04/22/2016 01/15/2016 12/26/2015 11/13/2015 08/14/2015  Falls in the past year? Yes No No No No  Number falls in past yr: 1 - - - -  Injury with Fall? Yes - - - -     Functional Status Survey: Is the patient deaf or have difficulty hearing?: No Does the patient have difficulty seeing, even when wearing glasses/contacts?: Yes (glasses) Does the patient have difficulty concentrating, remembering, or making decisions?: No Does the patient have difficulty walking or climbing stairs?: Yes Does the patient have difficulty dressing or bathing?: No Does the patient have difficulty doing errands alone such as visiting a doctor's office or shopping?: No    Assessment & Plan  1. Controlled type 2 diabetes mellitus with stage 3 chronic kidney disease, without long-term current use of insulin ([place])  Still at goal, but would prefer if a little lower, advised to follow diet, she does not want to change medication at this time - POCT HgB A1C  2. Need for pneumococcal vaccination  - Pneumococcal polysaccharide vaccine 23-valent greater than or equal to 2yo subcutaneous/IM  3. Essential hypertension  Discussed changing to beta-blocker but she would like to hold off for  now.  -  diltiazem (DILACOR XR) 180 MG 24 hr capsule; Take 1 capsule (180 mg total) by mouth daily. For bp  Dispense: 90 capsule; Refill: 1  4. Polymyalgia rheumatica ([place])  Still seeing Rheumatologist, recnet had labs - comp panel and CBC.  5. Depression, major, recurrent, moderate ([place])  She is still depressed, still seeing therapist, she states that when she tried going up on Cymbalta it made her feel less engaged. Discussed changing therapy and she is wiling to try it  6. Senile purpura (HCC)  Stable, reassurance given   7. Tremors of nervous system  Seen by Dr. Manuella Ghazi, MRI showed some atrophy, but no masses or plaques  8. Psoriatic arthritis ([place])  stable  9. Dyslipidemia  - Lipid panel  10. Long-term use of high-risk medication  - Vitamin B12

## 2016-04-29 ENCOUNTER — Telehealth: Payer: Self-pay | Admitting: Family Medicine

## 2016-04-29 NOTE — Telephone Encounter (Signed)
Christine Jensen from [employer] is requesting a return call to discuss patient. There is a medical release on file that was scanned in in June or July for you to speak with Otila Kluver (P) [phone]

## 2016-04-30 NOTE — Telephone Encounter (Signed)
Attempted to reach Christine Jensen left voicemail for her to return my call concerning pt

## 2016-05-08 DIAGNOSIS — F331 Major depressive disorder, recurrent, moderate: Secondary | ICD-10-CM | POA: Diagnosis not present

## 2016-05-08 DIAGNOSIS — Z600 Problems of adjustment to life-cycle transitions: Secondary | ICD-10-CM | POA: Diagnosis not present

## 2016-05-08 DIAGNOSIS — F411 Generalized anxiety disorder: Secondary | ICD-10-CM | POA: Diagnosis not present

## 2016-05-20 ENCOUNTER — Telehealth: Payer: Self-pay | Admitting: Family Medicine

## 2016-05-20 NOTE — Telephone Encounter (Signed)
Pt requesting Zolpidem ER 12.5mg , please send to [facility]. Asking for 90day supply. Only have a week supply left.

## 2016-05-22 ENCOUNTER — Other Ambulatory Visit: Payer: Self-pay | Admitting: Family Medicine

## 2016-05-22 DIAGNOSIS — F331 Major depressive disorder, recurrent, moderate: Secondary | ICD-10-CM | POA: Diagnosis not present

## 2016-05-22 DIAGNOSIS — G4700 Insomnia, unspecified: Secondary | ICD-10-CM

## 2016-05-22 DIAGNOSIS — F411 Generalized anxiety disorder: Secondary | ICD-10-CM | POA: Diagnosis not present

## 2016-05-22 DIAGNOSIS — Z600 Problems of adjustment to life-cycle transitions: Secondary | ICD-10-CM | POA: Diagnosis not present

## 2016-05-23 NOTE — Telephone Encounter (Signed)
Patient [facility] 

## 2016-05-24 ENCOUNTER — Other Ambulatory Visit: Payer: Self-pay | Admitting: Family Medicine

## 2016-05-24 DIAGNOSIS — G4700 Insomnia, unspecified: Secondary | ICD-10-CM

## 2016-05-24 MED ORDER — ZOLPIDEM TARTRATE ER 12.5 MG PO TBCR: 12.5000 mg | EXTENDED_RELEASE_TABLET | Freq: Every evening | ORAL | 0 refills | Status: DC | PRN

## 2016-05-26 ENCOUNTER — Other Ambulatory Visit: Payer: Self-pay | Admitting: Family Medicine

## 2016-05-26 DIAGNOSIS — G4700 Insomnia, unspecified: Secondary | ICD-10-CM

## 2016-05-27 ENCOUNTER — Other Ambulatory Visit: Payer: Self-pay | Admitting: Family Medicine

## 2016-05-27 DIAGNOSIS — G4700 Insomnia, unspecified: Secondary | ICD-10-CM

## 2016-05-27 DIAGNOSIS — Z79899 Other long term (current) drug therapy: Secondary | ICD-10-CM | POA: Diagnosis not present

## 2016-05-27 DIAGNOSIS — E785 Hyperlipidemia, unspecified: Secondary | ICD-10-CM | POA: Diagnosis not present

## 2016-05-27 NOTE — Telephone Encounter (Signed)
Patient [facility] 

## 2016-05-28 ENCOUNTER — Other Ambulatory Visit: Payer: Self-pay | Admitting: Family Medicine

## 2016-05-28 DIAGNOSIS — E538 Deficiency of other specified B group vitamins: Secondary | ICD-10-CM | POA: Insufficient documentation

## 2016-05-28 LAB — LIPID PANEL
Cholesterol: 144 mg/dL (ref <200)
HDL: 45 mg/dL — AB (ref >50)
LDL CALC: 79 mg/dL (ref <100)
Total CHOL/HDL Ratio: 3.2 Ratio (ref <5.0)
Triglycerides: 99 mg/dL (ref <150)
VLDL: 20 mg/dL (ref <30)

## 2016-05-28 LAB — VITAMIN B12: VITAMIN B 12: 220 pg/mL (ref 200–1100)

## 2016-06-04 DIAGNOSIS — F411 Generalized anxiety disorder: Secondary | ICD-10-CM | POA: Diagnosis not present

## 2016-06-04 DIAGNOSIS — Z600 Problems of adjustment to life-cycle transitions: Secondary | ICD-10-CM | POA: Diagnosis not present

## 2016-06-04 DIAGNOSIS — F331 Major depressive disorder, recurrent, moderate: Secondary | ICD-10-CM | POA: Diagnosis not present

## 2016-06-17 ENCOUNTER — Encounter: Payer: Self-pay | Admitting: Family Medicine

## 2016-06-17 ENCOUNTER — Ambulatory Visit (INDEPENDENT_AMBULATORY_CARE_PROVIDER_SITE_OTHER): Payer: Medicare Other | Admitting: Family Medicine

## 2016-06-17 VITALS — BP 116/64 | HR 89 | Temp 99.0°F | Resp 16 | Ht 66.0 in | Wt 204.8 lb

## 2016-06-17 DIAGNOSIS — L308 Other specified dermatitis: Secondary | ICD-10-CM | POA: Diagnosis not present

## 2016-06-17 DIAGNOSIS — E538 Deficiency of other specified B group vitamins: Secondary | ICD-10-CM | POA: Diagnosis not present

## 2016-06-17 DIAGNOSIS — F331 Major depressive disorder, recurrent, moderate: Secondary | ICD-10-CM | POA: Diagnosis not present

## 2016-06-17 DIAGNOSIS — F411 Generalized anxiety disorder: Secondary | ICD-10-CM | POA: Diagnosis not present

## 2016-06-17 MED ORDER — DESVENLAFAXINE SUCCINATE ER 50 MG PO TB24: 50.0000 mg | ORAL_TABLET | ORAL | 0 refills | Status: DC

## 2016-06-17 MED ORDER — CYANOCOBALAMIN 1000 MCG/ML IJ SOLN
1000.0000 ug | Freq: Once | INTRAMUSCULAR | Status: AC
  Administered 2016-06-17: 1000 ug via INTRAMUSCULAR

## 2016-06-17 MED ORDER — CYANOCOBALAMIN 1000 MCG/ML IJ SOLN: 1000.0000 ug | Freq: Once | INTRAMUSCULAR | 0 refills | Status: DC

## 2016-06-17 MED ORDER — DESVENLAFAXINE SUCCINATE ER 50 MG PO TB24: 50.0000 mg | ORAL_TABLET | Freq: Every day | ORAL | 0 refills | Status: DC

## 2016-06-17 NOTE — Progress Notes (Signed)
Name: Christine Jensen   MRN: [id number]    DOB: June 17, 1950   Date:06/17/2016       Progress Note  Subjective  Chief Complaint  Chief Complaint  Patient presents with  . Follow-up    6 week F/U-Discuss Labs  . Depression    States the Lexapro has made her anti social and would rather stay in bed all day. Would like to go back to Cymbalta.  . Diabetes    Checks 3x daily, Averages-170-200's    HPI  Depression/GAD: she continues to struggle, sees therapist, unable to tolerate higher dose of Cymbalta, we switched to Lexapro on her last visit and she states since changed from Cymbalta to Lexapro she has less energy, severe anhedonia, pain is worse, she still has problems sleeping - but doing better since she started to sleep on guest bedroom to avoid her husband.  Having more crying spells, irritability is higher, feeling more sad. We will try switching to Pristiq today  B12 deficiency: explained that may in part be causing some of her fatigue, she takes Metformin and does not eat meat.    Patient Active Problem List   Diagnosis Date Noted  . B12 deficiency 05/28/2016  . Perennial allergic rhinitis with seasonal variation 01/15/2016  . Senile purpura ([place]) 01/15/2016  . Polymyalgia rheumatica ([place]) 11/13/2015  . Controlled type 2 diabetes mellitus with stage 3 chronic kidney disease, without long-term current use of insulin ([place]) 11/13/2015  . Osteoarthritis 08/01/2015  . Psoriatic arthritis ([place]) 08/01/2015  . Absence of sense of taste 02/09/2015  . Paraspinal mass 02/09/2015  . Hypertension, benign 02/09/2015  . Postherpetic neuralgia 02/09/2015  . IBS (irritable bowel syndrome) 12/08/2014  . Insomnia, persistent 12/08/2014  . Chronic back pain 12/08/2014  . Depression, major, recurrent, moderate ([place]) 11/24/2014  . Generalized anxiety disorder 11/24/2014  . Hypercholesteremia 09/15/2014  . Hypothyroid 09/15/2014    Past Surgical History:  Procedure Laterality Date  .  ABDOMINAL PERINEAL BOWEL RESECTION    . APPENDECTOMY    . HERNIA REPAIR    . THYROIDECTOMY Left    Dr. Ronnald Collum on 03/06/16    Family History  Problem Relation Age of Onset  . Arthritis Father   . Heart disease Mother   . Alzheimer's disease Mother     Social History   Social History  . Marital status: Married    Spouse name: N/A  . Number of children: N/A  . Years of education: N/A   Occupational History  . Not on file.   Social History Main Topics  . Smoking status: Never Smoker  . Smokeless tobacco: Never Used  . Alcohol use 0.0 oz/week     Comment: rarely  . Drug use: No  . Sexual activity: Yes    Partners: Male    Birth control/ protection: None   Other Topics Concern  . Not on file   Social History Narrative   ** Merged History Encounter **         Current Outpatient Prescriptions:  .  aspirin 81 MG tablet, Take 1 tablet by mouth every other day. , Disp: , Rfl:  .  clobetasol (TEMOVATE) 0.05 % external solution, , Disp: , Rfl:  .  Clobetasol Propionate 0.05 % shampoo, , Disp: , Rfl:  .  cyclobenzaprine (FLEXERIL) 10 MG tablet, TAKE 1 TABLET BY MOUTH UP TO THREE TIMES DAILY FOR MUSCLE SPASMS, Disp: 90 tablet, Rfl: 0 .  dicyclomine (BENTYL) 20 MG tablet, Take 20 mg by mouth 3 (  three) times daily., Disp: , Rfl: 4 .  diltiazem (DILACOR XR) 180 MG 24 hr capsule, Take 1 capsule (180 mg total) by mouth daily. For bp, Disp: 90 capsule, Rfl: 1 .  fluticasone (FLONASE) 50 MCG/ACT nasal spray, Place 2 sprays into both nostrils daily., Disp: 16 g, Rfl: 0 .  folic acid (FOLVITE) 1 MG tablet, Take 1 mg by mouth daily., Disp: , Rfl: 2 .  glipiZIDE (GLUCOTROL XL) 5 MG 24 hr tablet, TAKE TAKE 1 TABLET BY MOUTH EVERY DAY WITH BREAKFAST, Disp: 90 tablet, Rfl: 0 .  glucose blood test strip, Use as instructed, Disp: 100 each, Rfl: 12 .  HYDROcodone-acetaminophen (NORCO) 10-325 MG tablet, Take 1 tablet by mouth every 6 (six) hours as needed., Disp: 40 tablet, Rfl: 0 .  JUBLIA  10 % SOLN, , Disp: , Rfl:  .  levothyroxine (SYNTHROID, LEVOTHROID) 112 MCG tablet, Take 1 tablet (112 mcg total) by mouth daily before breakfast., Disp: 90 tablet, Rfl: 1 .  lidocaine (LIDODERM) 5 %, , Disp: , Rfl: 2 .  losartan-hydrochlorothiazide (HYZAAR) 50-12.5 MG tablet, Take 1 tablet by mouth daily. For bp, Disp: 90 tablet, Rfl: 1 .  metFORMIN (GLUCOPHAGE-XR) 750 MG 24 hr tablet, Take 1 tablet (750 mg total) by mouth daily with breakfast., Disp: 90 tablet, Rfl: 1 .  methotrexate (RHEUMATREX) 2.5 MG tablet, TAKE 8 TABLETS ONCE A WEEK, Disp: , Rfl: 2 .  mometasone (ELOCON) 0.1 % lotion, APPLY TO ITCHY EARS TWICE DAILY FOR 12 DAYS. MAY REPEAT AS NEEDED FOR ITCHY EARS, Disp: , Rfl: 10 .  pravastatin (PRAVACHOL) 40 MG tablet, Take 1 tablet (40 mg total) by mouth daily., Disp: 90 tablet, Rfl: 1 .  predniSONE (DELTASONE) 2.5 MG tablet, Take 5 mg by mouth every morning. , Disp: , Rfl:  .  pyrithione zinc (HEAD AND SHOULDERS) 1 % shampoo, Apply topically daily as needed for itching., Disp: , Rfl:  .  zolpidem (AMBIEN CR) 12.5 MG CR tablet, Take 1 tablet (12.5 mg total) by mouth at bedtime as needed for sleep., Disp: 90 tablet, Rfl: 0 .  cyanocobalamin (,VITAMIN B-12,) 1000 MCG/ML injection, Inject 1 mL (1,000 mcg total) into the muscle once., Disp: 1 mL, Rfl: 0 .  desvenlafaxine (PRISTIQ) 50 MG 24 hr tablet, Take 1 tablet (50 mg total) by mouth every morning., Disp: 90 tablet, Rfl: 0  Allergies  Allergen Reactions  . Erythromycin Shortness Of Breath  . Macadamia Nut Oil Shortness Of Breath    Oily nuts   . Codeine   . Lexapro [Escitalopram] Other (See Comments)    Lethargy and inefective   . Penicillins   . Sulfa Antibiotics Other (See Comments)    Joints become painful   . Aspartame Rash  . Other Rash    Shoes and Environmental     ROS  Constitutional: Negative for fever or weight change.  Respiratory: Negative for cough and shortness of breath.   Cardiovascular: Negative for chest  pain or palpitations.  Gastrointestinal: Negative for abdominal pain, no bowel changes.  Musculoskeletal: Positive  for gait problem or joint swelling.  Skin:Positive for rash.  Neurological: Negative for dizziness or headache.  No other specific complaints in a complete review of systems (except as listed in HPI above).  Objective  Vitals:   06/17/16 1400  BP: 116/64  Pulse: 89  Resp: 16  Temp: 99 F (37.2 C)  TempSrc: Oral  SpO2: 98%  Weight: 204 lb 12.8 oz (92.9 kg)  Height: 5\' 6"  (  1.676 m)    Body mass index is 33.06 kg/m.  Physical Exam  Constitutional: Patient appears well-developed and well-nourished. Obese  No distress.  HEENT: head atraumatic, normocephalic, pupils equal and reactive to light, neck supple, throat within normal limits Cardiovascular: Normal rate, regular rhythm and normal heart sounds.  No murmur heard. No BLE edema. Pulmonary/Chest: Effort normal and breath sounds normal. No respiratory distress. Abdominal: Soft.  There is no tenderness. Psychiatric: Patient has a normal mood and affect. behavior is normal.  Muscular skeletal: slow gait, pain on left hip Skin; erythematous rash on left anterior leg, no oozing, seems superficial - likely eczema  Recent Results (from the past 2160 hour(s))  POCT HgB A1C     Status: None   Collection Time: 04/22/16  2:02 PM  Result Value Ref Range   Hemoglobin A1C 7.3   Lipid panel     Status: Abnormal   Collection Time: 05/27/16  1:38 PM  Result Value Ref Range   Cholesterol 144 <200 mg/dL   Triglycerides 99 <150 mg/dL   HDL 45 (L) >50 mg/dL   Total CHOL/HDL Ratio 3.2 <5.0 Ratio   VLDL 20 <30 mg/dL   LDL Cholesterol 79 <100 mg/dL  Vitamin B12     Status: None   Collection Time: 05/27/16  1:38 PM  Result Value Ref Range   Vitamin B-12 220 200 - 1,100 pg/mL      PHQ2/9: Depression screen [facility] 2/9 04/22/2016 01/15/2016 12/26/2015 11/13/2015 08/14/2015  Decreased Interest 3 1 2  0 0  Down, Depressed, Hopeless  3 1 2 3  0  PHQ - 2 Score 6 2 4 3  0  Altered sleeping 3 3 2 2  -  Tired, decreased energy 0 1 1 2  -  Change in appetite 3 3 1  0 -  Feeling bad or failure about yourself  3 1 1  0 -  Trouble concentrating 0 1 0 1 -  Moving slowly or fidgety/restless 0 1 0 0 -  Suicidal thoughts 0 0 0 0 -  PHQ-9 Score 15 12 9 8  -  Difficult doing work/chores Somewhat difficult - Somewhat difficult - -     Fall Risk: Fall Risk  04/22/2016 01/15/2016 12/26/2015 11/13/2015 08/14/2015  Falls in the past year? Yes No No No No  Number falls in past yr: 1 - - - -  Injury with Fall? Yes - - - -     Assessment & Plan  1. Depression, major, recurrent, moderate (HCC)  - desvenlafaxine (PRISTIQ) 50 MG 24 hr tablet; Take 1 tablet (50 mg total) by mouth every morning.  Dispense: 90 tablet; Refill: 0  2. B12 deficiency  - cyanocobalamin (,VITAMIN B-12,) 1000 MCG/ML injection; Inject 1 mL (1,000 mcg total) into the muscle once.  Dispense: 1 mL; Refill: 0  3. Generalized anxiety disorder  We will try Pristiq  4. Other eczema  She has topical steroid, advised to continue and if no resolution refer to Dermatologist

## 2016-06-17 NOTE — Addendum Note (Signed)
Addended by: Inda Coke on: 06/17/2016 02:58 PM   Modules accepted: Orders

## 2016-06-19 DIAGNOSIS — F331 Major depressive disorder, recurrent, moderate: Secondary | ICD-10-CM | POA: Diagnosis not present

## 2016-06-19 DIAGNOSIS — Z600 Problems of adjustment to life-cycle transitions: Secondary | ICD-10-CM | POA: Diagnosis not present

## 2016-06-19 DIAGNOSIS — F411 Generalized anxiety disorder: Secondary | ICD-10-CM | POA: Diagnosis not present

## 2016-06-27 ENCOUNTER — Other Ambulatory Visit: Payer: Self-pay | Admitting: Family Medicine

## 2016-06-27 DIAGNOSIS — I10 Essential (primary) hypertension: Secondary | ICD-10-CM

## 2016-06-30 DIAGNOSIS — Z6832 Body mass index (BMI) 32.0-32.9, adult: Secondary | ICD-10-CM

## 2016-06-30 DIAGNOSIS — E6609 Other obesity due to excess calories: Secondary | ICD-10-CM | POA: Insufficient documentation

## 2016-06-30 DIAGNOSIS — R9082 White matter disease, unspecified: Secondary | ICD-10-CM | POA: Diagnosis not present

## 2016-06-30 DIAGNOSIS — G252 Other specified forms of tremor: Secondary | ICD-10-CM | POA: Insufficient documentation

## 2016-06-30 DIAGNOSIS — E66811 Obesity, class 1: Secondary | ICD-10-CM | POA: Insufficient documentation

## 2016-07-01 DIAGNOSIS — M1990 Unspecified osteoarthritis, unspecified site: Secondary | ICD-10-CM | POA: Diagnosis not present

## 2016-07-01 DIAGNOSIS — M353 Polymyalgia rheumatica: Secondary | ICD-10-CM | POA: Diagnosis not present

## 2016-07-01 DIAGNOSIS — L409 Psoriasis, unspecified: Secondary | ICD-10-CM | POA: Diagnosis not present

## 2016-07-01 DIAGNOSIS — L4050 Arthropathic psoriasis, unspecified: Secondary | ICD-10-CM | POA: Diagnosis not present

## 2016-07-03 DIAGNOSIS — F411 Generalized anxiety disorder: Secondary | ICD-10-CM | POA: Diagnosis not present

## 2016-07-03 DIAGNOSIS — Z600 Problems of adjustment to life-cycle transitions: Secondary | ICD-10-CM | POA: Diagnosis not present

## 2016-07-03 DIAGNOSIS — F331 Major depressive disorder, recurrent, moderate: Secondary | ICD-10-CM | POA: Diagnosis not present

## 2016-07-17 ENCOUNTER — Telehealth: Payer: Self-pay | Admitting: Family Medicine

## 2016-07-17 DIAGNOSIS — F331 Major depressive disorder, recurrent, moderate: Secondary | ICD-10-CM | POA: Diagnosis not present

## 2016-07-17 DIAGNOSIS — F411 Generalized anxiety disorder: Secondary | ICD-10-CM | POA: Diagnosis not present

## 2016-07-17 DIAGNOSIS — Z600 Problems of adjustment to life-cycle transitions: Secondary | ICD-10-CM | POA: Diagnosis not present

## 2016-07-17 NOTE — Telephone Encounter (Signed)
Pts mental health therapist called and would like a call back to give a update. Her name is Miguel Dibble and her # [phone].

## 2016-07-18 NOTE — Telephone Encounter (Signed)
Spoke to [employer] she stated that pt is very engaged, has situational stress and still has a ways to go but the prestiq is doing very well for her.

## 2016-07-21 ENCOUNTER — Ambulatory Visit: Payer: Medicare Other

## 2016-07-21 DIAGNOSIS — E538 Deficiency of other specified B group vitamins: Secondary | ICD-10-CM

## 2016-07-21 MED ORDER — CYANOCOBALAMIN 1000 MCG/ML IJ SOLN
1000.0000 ug | Freq: Once | INTRAMUSCULAR | Status: AC
  Administered 2016-07-21: 1000 ug via INTRAMUSCULAR

## 2016-07-31 DIAGNOSIS — F331 Major depressive disorder, recurrent, moderate: Secondary | ICD-10-CM | POA: Diagnosis not present

## 2016-07-31 DIAGNOSIS — F411 Generalized anxiety disorder: Secondary | ICD-10-CM | POA: Diagnosis not present

## 2016-07-31 DIAGNOSIS — Z600 Problems of adjustment to life-cycle transitions: Secondary | ICD-10-CM | POA: Diagnosis not present

## 2016-08-06 ENCOUNTER — Other Ambulatory Visit: Payer: Self-pay | Admitting: Family Medicine

## 2016-08-06 DIAGNOSIS — E1122 Type 2 diabetes mellitus with diabetic chronic kidney disease: Secondary | ICD-10-CM

## 2016-08-06 DIAGNOSIS — N183 Chronic kidney disease, stage 3 (moderate): Principal | ICD-10-CM

## 2016-08-06 NOTE — Telephone Encounter (Signed)
Pharm[facility]

## 2016-08-08 ENCOUNTER — Other Ambulatory Visit: Payer: Self-pay | Admitting: Family Medicine

## 2016-08-08 DIAGNOSIS — E538 Deficiency of other specified B group vitamins: Secondary | ICD-10-CM

## 2016-08-09 ENCOUNTER — Other Ambulatory Visit: Payer: Self-pay | Admitting: Family Medicine

## 2016-08-09 DIAGNOSIS — E785 Hyperlipidemia, unspecified: Secondary | ICD-10-CM

## 2016-08-18 ENCOUNTER — Other Ambulatory Visit: Payer: Self-pay | Admitting: Family Medicine

## 2016-08-18 ENCOUNTER — Ambulatory Visit: Payer: Self-pay | Admitting: Family Medicine

## 2016-08-18 DIAGNOSIS — G4700 Insomnia, unspecified: Secondary | ICD-10-CM

## 2016-08-18 DIAGNOSIS — E538 Deficiency of other specified B group vitamins: Secondary | ICD-10-CM

## 2016-08-18 MED ORDER — ZOLPIDEM TARTRATE ER 12.5 MG PO TBCR: 12.5000 mg | EXTENDED_RELEASE_TABLET | Freq: Every evening | ORAL | 0 refills | Status: DC | PRN

## 2016-08-18 NOTE — Telephone Encounter (Signed)
Pt had appt today but rescheduled for this Thursday due to you not being in office. She only have 2 pills left of zolpidem [place] 12.5mg  . Please send to [facility] with a 90day supply

## 2016-08-18 NOTE — Telephone Encounter (Signed)
[facility]

## 2016-08-19 ENCOUNTER — Other Ambulatory Visit: Payer: Self-pay | Admitting: Family Medicine

## 2016-08-19 DIAGNOSIS — G4700 Insomnia, unspecified: Secondary | ICD-10-CM

## 2016-08-19 DIAGNOSIS — E538 Deficiency of other specified B group vitamins: Secondary | ICD-10-CM

## 2016-08-19 DIAGNOSIS — L400 Psoriasis vulgaris: Secondary | ICD-10-CM | POA: Diagnosis not present

## 2016-08-21 ENCOUNTER — Encounter: Payer: Self-pay | Admitting: Family Medicine

## 2016-08-21 ENCOUNTER — Ambulatory Visit (INDEPENDENT_AMBULATORY_CARE_PROVIDER_SITE_OTHER): Payer: Medicare Other | Admitting: Family Medicine

## 2016-08-21 VITALS — BP 118/64 | HR 102 | Temp 98.1°F | Resp 16 | Ht 66.0 in | Wt 199.4 lb

## 2016-08-21 DIAGNOSIS — E1165 Type 2 diabetes mellitus with hyperglycemia: Secondary | ICD-10-CM

## 2016-08-21 DIAGNOSIS — F331 Major depressive disorder, recurrent, moderate: Secondary | ICD-10-CM

## 2016-08-21 DIAGNOSIS — L4050 Arthropathic psoriasis, unspecified: Secondary | ICD-10-CM

## 2016-08-21 DIAGNOSIS — E538 Deficiency of other specified B group vitamins: Secondary | ICD-10-CM | POA: Diagnosis not present

## 2016-08-21 DIAGNOSIS — E1122 Type 2 diabetes mellitus with diabetic chronic kidney disease: Secondary | ICD-10-CM

## 2016-08-21 DIAGNOSIS — M353 Polymyalgia rheumatica: Secondary | ICD-10-CM

## 2016-08-21 DIAGNOSIS — N183 Chronic kidney disease, stage 3 (moderate): Secondary | ICD-10-CM | POA: Diagnosis not present

## 2016-08-21 DIAGNOSIS — Z1231 Encounter for screening mammogram for malignant neoplasm of breast: Secondary | ICD-10-CM

## 2016-08-21 DIAGNOSIS — F411 Generalized anxiety disorder: Secondary | ICD-10-CM | POA: Diagnosis not present

## 2016-08-21 DIAGNOSIS — I10 Essential (primary) hypertension: Secondary | ICD-10-CM

## 2016-08-21 DIAGNOSIS — D692 Other nonthrombocytopenic purpura: Secondary | ICD-10-CM

## 2016-08-21 DIAGNOSIS — IMO0002 Reserved for concepts with insufficient information to code with codable children: Secondary | ICD-10-CM

## 2016-08-21 MED ORDER — CYANOCOBALAMIN 1000 MCG/ML IJ SOLN
1000.0000 ug | Freq: Once | INTRAMUSCULAR | Status: AC
  Administered 2016-08-21: 1000 ug via INTRAMUSCULAR

## 2016-08-21 MED ORDER — DILTIAZEM HCL ER 180 MG PO CP24: 180.0000 mg | ORAL_CAPSULE | Freq: Every day | ORAL | 1 refills | Status: DC

## 2016-08-21 MED ORDER — DULAGLUTIDE 1.5 MG/0.5ML ~~LOC~~ SOAJ: 1.5000 mg | SUBCUTANEOUS | 1 refills | Status: DC

## 2016-08-21 NOTE — Progress Notes (Signed)
Name: Christine Jensen   MRN: [id number]    DOB: March 09, 1951   Date:08/21/2016       Progress Note  Subjective  Chief Complaint  Chief Complaint  Patient presents with  . Diabetes    2 month follow up  . Depression  . Insomnia  . Hypertension    HPI  Depression/GAD: She is feeling better since we changed to Pristiq back in 05/2016. She has failed Lexapro and Cymbalta. She has noticed that she is more patient with her husband and less frustrated, but still has fatigue - mostly from rheumatological problems. Denies suicidal thoughts and ideation.   B12 deficiency: explained that may in part be causing some of her fatigue, she takes Metformin and does not eat meat. We will continue monthly injections  DMII: she is on Glipizide 5 mg XL and metformin 750 mg and last hgbA1C was 7.3%, however she is back on prednisone for PMR and  Fasting glucose is averaging 190.  She denies polyphagia, polyuria or polydipsia. She has been going to water aerobic twice weekly, she tries to walk when not in severe pain.  Discussed changing therapy, she is willing to try GLP-1, we will try Trulicity and stop Glipizide, continue Metformin for now  Hypothyroidism: she sees Dr. Ronnald Collum was seen a few months ago, he checks her TSH  Tremors: constant, present at rest but worse with intension. Occasionally has numbness on right hand. She denies change in balance. No family history of tremors. Not related to hypoglycemic episodes. She was seen by Dr. Manuella Ghazi, per patient she does not have Parkinson  Senile Purpura: she has noticed easy bruising on both arms  HTN: taking bp medication and no chest pain or palpitation. She denies constipation from calcium channel blocker.  PMR: she is back on prednisone, 2.5 mg daily unable to walk without medication, they are thinking about change to Methotrexate  Insomnia: she is on high dose Ambien, discussed that is above the recommended dose of Ambien for females but she states she  can't sleep without it.   Back pain: she has seen multiple sub-specialists, small paraspinal mass but not a candidate for surgery and not sure if the cause of symptoms. Could tolerate Gabapentin, she states pain in constant but gets worse at times. She states water aerobics is helpful. She states there is no pain while in the water.  Obesity: she has been doing water aerobics three times a week in the evening, those days she eats very early and does not snack before bed time.   Patient Active Problem List   Diagnosis Date Noted  . Class 1 obesity due to excess calories with serious comorbidity and body mass index (BMI) of 32.0 to 32.9 in adult 06/30/2016  . Action tremor 06/30/2016  . B12 deficiency 05/28/2016  . Perennial allergic rhinitis with seasonal variation 01/15/2016  . Senile purpura ([place]) 01/15/2016  . Polymyalgia rheumatica ([place]) 11/13/2015  . Controlled type 2 diabetes mellitus with stage 3 chronic kidney disease, without long-term current use of insulin ([place]) 11/13/2015  . Osteoarthritis 08/01/2015  . Psoriatic arthritis ([place]) 08/01/2015  . Absence of sense of taste 02/09/2015  . Paraspinal mass 02/09/2015  . Hypertension, benign 02/09/2015  . Postherpetic neuralgia 02/09/2015  . IBS (irritable bowel syndrome) 12/08/2014  . Insomnia, persistent 12/08/2014  . Chronic back pain 12/08/2014  . Depression, major, recurrent, moderate ([place]) 11/24/2014  . Generalized anxiety disorder 11/24/2014  . Hypercholesteremia 09/15/2014  . Hypothyroid 09/15/2014    Past Surgical  History:  Procedure Laterality Date  . ABDOMINAL PERINEAL BOWEL RESECTION    . APPENDECTOMY    . HERNIA REPAIR    . THYROIDECTOMY Left    Dr. Ronnald Collum on 03/06/16    Family History  Problem Relation Age of Onset  . Arthritis Father   . Heart disease Mother   . Alzheimer's disease Mother     Social History   Social History  . Marital status: Married    Spouse name: N/A  . Number of children: N/A   . Years of education: N/A   Occupational History  . Not on file.   Social History Main Topics  . Smoking status: Never Smoker  . Smokeless tobacco: Never Used  . Alcohol use 0.0 oz/week     Comment: rarely  . Drug use: No  . Sexual activity: Yes    Partners: Male    Birth control/ protection: None   Other Topics Concern  . Not on file   Social History Narrative   ** Merged History Encounter **         Current Outpatient Prescriptions:  .  aspirin 81 MG tablet, Take 1 tablet by mouth every other day. , Disp: , Rfl:  .  clobetasol (TEMOVATE) 0.05 % external solution, , Disp: , Rfl:  .  Clobetasol Propionate 0.05 % shampoo, , Disp: , Rfl:  .  cyclobenzaprine (FLEXERIL) 10 MG tablet, TAKE 1 TABLET BY MOUTH UP TO THREE TIMES DAILY FOR MUSCLE SPASMS, Disp: 90 tablet, Rfl: 0 .  desvenlafaxine (PRISTIQ) 50 MG 24 hr tablet, Take 1 tablet (50 mg total) by mouth every morning., Disp: 90 tablet, Rfl: 0 .  dicyclomine (BENTYL) 20 MG tablet, Take 20 mg by mouth 3 (three) times daily., Disp: , Rfl: 4 .  diltiazem (DILACOR XR) 180 MG 24 hr capsule, Take 1 capsule (180 mg total) by mouth daily. For bp, Disp: 90 capsule, Rfl: 1 .  Dulaglutide (TRULICITY) 1.5 DP/8.2UM SOPN, Inject 1.5 mg into the skin once a week., Disp: 12 pen, Rfl: 1 .  fluticasone (FLONASE) 50 MCG/ACT nasal spray, Place 2 sprays into both nostrils daily., Disp: 16 g, Rfl: 0 .  folic acid (FOLVITE) 1 MG tablet, Take 1 mg by mouth daily., Disp: , Rfl: 2 .  glucose blood test strip, Use as instructed, Disp: 100 each, Rfl: 12 .  HYDROcodone-acetaminophen (NORCO) 10-325 MG tablet, Take 1 tablet by mouth every 6 (six) hours as needed., Disp: 40 tablet, Rfl: 0 .  JUBLIA 10 % SOLN, , Disp: , Rfl:  .  levothyroxine (SYNTHROID, LEVOTHROID) 112 MCG tablet, Take 1 tablet (112 mcg total) by mouth daily before breakfast., Disp: 90 tablet, Rfl: 1 .  lidocaine (LIDODERM) 5 %, , Disp: , Rfl: 2 .  losartan-hydrochlorothiazide (HYZAAR)  50-12.5 MG tablet, TAKE 1 TABLET DAILY FOR    BLOOD PRESSURE, Disp: 90 tablet, Rfl: 1 .  metFORMIN (GLUCOPHAGE-XR) 750 MG 24 hr tablet, TAKE 1 TABLET (750 MG TOTAL) BY MOUTH DAILY WITH BREAKFAST., Disp: 90 tablet, Rfl: 1 .  methotrexate (RHEUMATREX) 2.5 MG tablet, TAKE 8 TABLETS ONCE A WEEK, Disp: , Rfl: 2 .  mometasone (ELOCON) 0.1 % lotion, APPLY TO ITCHY EARS TWICE DAILY FOR 12 DAYS. MAY REPEAT AS NEEDED FOR ITCHY EARS, Disp: , Rfl: 10 .  pravastatin (PRAVACHOL) 40 MG tablet, TAKE 1 TABLET DAILY, Disp: 90 tablet, Rfl: 1 .  predniSONE (DELTASONE) 2.5 MG tablet, Take 5 mg by mouth every morning. , Disp: , Rfl:  .  pyrithione zinc (HEAD AND SHOULDERS) 1 % shampoo, Apply topically daily as needed for itching., Disp: , Rfl:  .  zolpidem (AMBIEN CR) 12.5 MG CR tablet, Take 1 tablet (12.5 mg total) by mouth at bedtime as needed for sleep., Disp: 90 tablet, Rfl: 0  Current Facility-Administered Medications:  .  cyanocobalamin ((VITAMIN B-12)) injection 1,000 mcg, 1,000 mcg, Intramuscular, Once, Steele Sizer, MD  Allergies  Allergen Reactions  . Erythromycin Shortness Of Breath  . Macadamia Nut Oil Shortness Of Breath    Oily nuts   . Codeine   . Lexapro [Escitalopram] Other (See Comments)    Lethargy and inefective   . Penicillins   . Sulfa Antibiotics Other (See Comments)    Joints become painful   . Aspartame Rash  . Other Rash    Shoes and Environmental     ROS  Constitutional: Negative for fever, positive for  weight change - loss.  Respiratory: Negative for cough and shortness of breath.   Cardiovascular: Negative for chest pain or palpitations.  Gastrointestinal: Negative for abdominal pain, no bowel changes.  Musculoskeletal: Negative for gait problem or joint swelling.  Skin: Negative for rash.  Neurological: Negative for dizziness or headache.  No other specific complaints in a complete review of systems (except as listed in HPI above).  Objective  Vitals:   08/21/16  1330  BP: 118/64  Pulse: (!) 102  Resp: 16  Temp: 98.1 F (36.7 C)  SpO2: 96%  Weight: 199 lb 7 oz (90.5 kg)  Height: 5\' 6"  (1.676 m)    Body mass index is 32.19 kg/m.  Physical Exam  Constitutional: Patient appears well-developed  Obese No distress.  HEENT: head atraumatic, normocephalic, pupils equal and reactive to light,  neck supple, throat within normal limits Cardiovascular: Normal rate, regular rhythm and normal heart sounds.  No murmur heard. No BLE edema. Pulmonary/Chest: Effort normal and breath sounds normal. No respiratory distress. Abdominal: Soft.  There is no tenderness. Psychiatric: Patient has a normal mood and affect. behavior is normal. Judgment and thought content normal. Muscular Skeletal: normal rom of joints, no synovitis, normal rom of knee, hips, decrease rom of shoulders because of pain, also has pain on right mid para spinal muscles.   Recent Results (from the past 2160 hour(s))  Lipid panel     Status: Abnormal   Collection Time: 05/27/16  1:38 PM  Result Value Ref Range   Cholesterol 144 <200 mg/dL   Triglycerides 99 <150 mg/dL   HDL 45 (L) >50 mg/dL   Total CHOL/HDL Ratio 3.2 <5.0 Ratio   VLDL 20 <30 mg/dL   LDL Cholesterol 79 <100 mg/dL  Vitamin B12     Status: None   Collection Time: 05/27/16  1:38 PM  Result Value Ref Range   Vitamin B-12 220 200 - 1,100 pg/mL     PHQ2/9: Depression screen [facility] 2/9 08/21/2016 04/22/2016 01/15/2016 12/26/2015 11/13/2015  Decreased Interest 0 3 1 2  0  Down, Depressed, Hopeless 1 3 1 2 3   PHQ - 2 Score 1 6 2 4 3   Altered sleeping 3 3 3 2 2   Tired, decreased energy 2 0 1 1 2   Change in appetite 3 3 3 1  0  Feeling bad or failure about yourself  2 3 1 1  0  Trouble concentrating 0 0 1 0 1  Moving slowly or fidgety/restless 0 0 1 0 0  Suicidal thoughts 0 0 0 0 0  PHQ-9 Score 11 15 12 9  8  Difficult doing work/chores Somewhat difficult Somewhat difficult - Somewhat difficult -     Fall Risk: Fall Risk   04/22/2016 01/15/2016 12/26/2015 11/13/2015 08/14/2015  Falls in the past year? Yes No No No No  Number falls in past yr: 1 - - - -  Injury with Fall? Yes - - - -     Assessment & Plan  1. Depression, major, recurrent, moderate ([place])  Doing much better on Pristiq, continue therapy  2. Generalized anxiety disorder  Doing better  3. Uncontrolled type 2 diabetes mellitus with stage 3 chronic kidney disease, without long-term current use of insulin (HCC)  Discussed possible side effects of medication,  - Dulaglutide (TRULICITY) 1.5 QT/6.2UQ SOPN; Inject 1.5 mg into the skin once a week.  Dispense: 12 pen; Refill: 1 - hgbA1C  4. Essential hypertension  - diltiazem (DILACOR XR) 180 MG 24 hr capsule; Take 1 capsule (180 mg total) by mouth daily. For bp  Dispense: 90 capsule; Refill: 1 -comp panel   5. Senile purpura ([facility])  Stable on right arm, stable  6. Polymyalgia rheumatica ([place])  Rheumatologist   7. Psoriatic arthritis ([place])  Continue Rheumatologist   8. B12 deficiency  - cyanocobalamin ((VITAMIN B-12)) injection 1,000 mcg; Inject 1 mL (1,000 mcg total) into the muscle once.  9. Encounter for screening mammogram for breast cancer  - MM Digital Screening; Future

## 2016-09-04 DIAGNOSIS — Z600 Problems of adjustment to life-cycle transitions: Secondary | ICD-10-CM | POA: Diagnosis not present

## 2016-09-04 DIAGNOSIS — F411 Generalized anxiety disorder: Secondary | ICD-10-CM | POA: Diagnosis not present

## 2016-09-04 DIAGNOSIS — F331 Major depressive disorder, recurrent, moderate: Secondary | ICD-10-CM | POA: Diagnosis not present

## 2016-09-05 ENCOUNTER — Other Ambulatory Visit: Payer: Self-pay | Admitting: Family Medicine

## 2016-09-05 DIAGNOSIS — N183 Chronic kidney disease, stage 3 (moderate): Principal | ICD-10-CM

## 2016-09-05 DIAGNOSIS — E1122 Type 2 diabetes mellitus with diabetic chronic kidney disease: Secondary | ICD-10-CM

## 2016-09-16 ENCOUNTER — Ambulatory Visit (INDEPENDENT_AMBULATORY_CARE_PROVIDER_SITE_OTHER): Payer: Medicare Other

## 2016-09-16 DIAGNOSIS — E538 Deficiency of other specified B group vitamins: Secondary | ICD-10-CM | POA: Diagnosis not present

## 2016-09-16 MED ORDER — CYANOCOBALAMIN 1000 MCG/ML IJ SOLN
1000.0000 ug | Freq: Once | INTRAMUSCULAR | Status: AC
  Administered 2016-09-16: 1000 ug via INTRAMUSCULAR

## 2016-09-17 ENCOUNTER — Ambulatory Visit: Payer: Commercial Indemnity

## 2016-09-18 ENCOUNTER — Ambulatory Visit
Admission: RE | Admit: 2016-09-18 | Discharge: 2016-09-18 | Disposition: A | Discharge status: Home / Self Care | Payer: Medicare Other | Source: Ambulatory Visit | Attending: Family Medicine | Admitting: Family Medicine

## 2016-09-18 DIAGNOSIS — Z1231 Encounter for screening mammogram for malignant neoplasm of breast: Secondary | ICD-10-CM | POA: Insufficient documentation

## 2016-09-25 DIAGNOSIS — F411 Generalized anxiety disorder: Secondary | ICD-10-CM | POA: Diagnosis not present

## 2016-09-25 DIAGNOSIS — F331 Major depressive disorder, recurrent, moderate: Secondary | ICD-10-CM | POA: Diagnosis not present

## 2016-09-25 DIAGNOSIS — Z600 Problems of adjustment to life-cycle transitions: Secondary | ICD-10-CM | POA: Diagnosis not present

## 2016-09-26 ENCOUNTER — Encounter: Payer: Self-pay | Admitting: Family Medicine

## 2016-09-26 ENCOUNTER — Other Ambulatory Visit: Payer: Self-pay | Admitting: Family Medicine

## 2016-09-26 DIAGNOSIS — F331 Major depressive disorder, recurrent, moderate: Secondary | ICD-10-CM

## 2016-09-26 DIAGNOSIS — G4700 Insomnia, unspecified: Secondary | ICD-10-CM

## 2016-09-28 ENCOUNTER — Other Ambulatory Visit: Payer: Self-pay | Admitting: Family Medicine

## 2016-09-28 MED ORDER — DESVENLAFAXINE SUCCINATE ER 50 MG PO TB24: 50.0000 mg | ORAL_TABLET | ORAL | 0 refills | Status: DC

## 2016-10-01 ENCOUNTER — Telehealth: Payer: Self-pay

## 2016-10-01 ENCOUNTER — Other Ambulatory Visit: Payer: Self-pay | Admitting: Family Medicine

## 2016-10-01 DIAGNOSIS — F331 Major depressive disorder, recurrent, moderate: Secondary | ICD-10-CM

## 2016-10-01 DIAGNOSIS — G4700 Insomnia, unspecified: Secondary | ICD-10-CM

## 2016-10-01 MED ORDER — DESVENLAFAXINE SUCCINATE ER 50 MG PO TB24: 50.0000 mg | ORAL_TABLET | ORAL | 0 refills | Status: DC

## 2016-10-01 NOTE — Telephone Encounter (Signed)
Patient states Pristiq was sent into the wrong pharmacy. [facility] was [facility] but [facility] [address] due to going out of town for 3 weeks and then coming back for a couple of days and leaving again for 2 more weeks. Patient will not receive this medication in time. Called and cancel prescription with Dewaine Oats at [place] and [facility] [place]. Also patient states she will run out of her Ambien before she is back. Talked to Mickel Baas at [facility] in [place], this prescription was filled on 08/19/16 with a 90 day supply. Patient wants to know can she get another prescription so she will not be out while she is gone.

## 2016-10-02 NOTE — Telephone Encounter (Signed)
Okay to call in [place] but we cannot do controlled until due.  We can always call it in to [place] if she runs out while she is there

## 2016-10-02 NOTE — Telephone Encounter (Signed)
Left message for patient explaining we can call in a 30 day supply of Ambien to [place] when she is due for medication around June 27.

## 2016-10-07 DIAGNOSIS — H52223 Regular astigmatism, bilateral: Secondary | ICD-10-CM | POA: Diagnosis not present

## 2016-10-07 DIAGNOSIS — H524 Presbyopia: Secondary | ICD-10-CM | POA: Diagnosis not present

## 2016-10-07 DIAGNOSIS — H5201 Hypermetropia, right eye: Secondary | ICD-10-CM | POA: Diagnosis not present

## 2016-10-07 DIAGNOSIS — H5212 Myopia, left eye: Secondary | ICD-10-CM | POA: Diagnosis not present

## 2016-10-07 DIAGNOSIS — I10 Essential (primary) hypertension: Secondary | ICD-10-CM | POA: Diagnosis not present

## 2016-10-07 DIAGNOSIS — E119 Type 2 diabetes mellitus without complications: Secondary | ICD-10-CM | POA: Diagnosis not present

## 2016-10-07 DIAGNOSIS — H2513 Age-related nuclear cataract, bilateral: Secondary | ICD-10-CM | POA: Diagnosis not present

## 2016-10-07 LAB — HM DIABETES EYE EXAM

## 2016-10-09 ENCOUNTER — Encounter: Payer: Self-pay | Admitting: Family Medicine

## 2016-10-28 ENCOUNTER — Ambulatory Visit (INDEPENDENT_AMBULATORY_CARE_PROVIDER_SITE_OTHER): Payer: Medicare Other

## 2016-10-28 DIAGNOSIS — Z6832 Body mass index (BMI) 32.0-32.9, adult: Secondary | ICD-10-CM | POA: Diagnosis not present

## 2016-10-28 DIAGNOSIS — E538 Deficiency of other specified B group vitamins: Secondary | ICD-10-CM

## 2016-10-28 DIAGNOSIS — E6609 Other obesity due to excess calories: Secondary | ICD-10-CM

## 2016-10-28 MED ORDER — CYANOCOBALAMIN 1000 MCG/ML IJ SOLN
1000.0000 ug | Freq: Once | INTRAMUSCULAR | Status: AC
  Administered 2016-10-28: 1000 ug via INTRAMUSCULAR

## 2016-10-29 ENCOUNTER — Encounter: Payer: Self-pay | Admitting: Family Medicine

## 2016-10-30 ENCOUNTER — Other Ambulatory Visit: Payer: Self-pay | Admitting: Family Medicine

## 2016-10-30 DIAGNOSIS — G4700 Insomnia, unspecified: Secondary | ICD-10-CM

## 2016-10-30 DIAGNOSIS — F411 Generalized anxiety disorder: Secondary | ICD-10-CM | POA: Diagnosis not present

## 2016-10-30 DIAGNOSIS — F331 Major depressive disorder, recurrent, moderate: Secondary | ICD-10-CM

## 2016-10-30 DIAGNOSIS — Z600 Problems of adjustment to life-cycle transitions: Secondary | ICD-10-CM | POA: Diagnosis not present

## 2016-10-30 MED ORDER — ZOLPIDEM TARTRATE ER 12.5 MG PO TBCR: 12.5000 mg | EXTENDED_RELEASE_TABLET | Freq: Every evening | ORAL | 0 refills | Status: DC | PRN

## 2016-10-30 NOTE — Telephone Encounter (Signed)
Left voicemail that Ambien is upfront ready for her to pick up.

## 2016-10-30 NOTE — Telephone Encounter (Signed)
Please let patient know once Lorrin Mais is faxed. Also, it is too early to refill Gisela, 90-day supply was provided on 10/01/16, she should have enough to get through her trip. Thank you!

## 2016-11-04 ENCOUNTER — Encounter: Payer: Self-pay | Admitting: Family Medicine

## 2016-11-04 ENCOUNTER — Other Ambulatory Visit: Payer: Self-pay | Admitting: Family Medicine

## 2016-11-04 ENCOUNTER — Ambulatory Visit (INDEPENDENT_AMBULATORY_CARE_PROVIDER_SITE_OTHER): Payer: Medicare Other | Admitting: Family Medicine

## 2016-11-04 VITALS — BP 128/76 | HR 96 | Temp 97.9°F | Resp 16 | Ht 66.0 in | Wt 196.0 lb

## 2016-11-04 DIAGNOSIS — G4700 Insomnia, unspecified: Secondary | ICD-10-CM | POA: Diagnosis not present

## 2016-11-04 DIAGNOSIS — J069 Acute upper respiratory infection, unspecified: Secondary | ICD-10-CM

## 2016-11-04 DIAGNOSIS — E1122 Type 2 diabetes mellitus with diabetic chronic kidney disease: Secondary | ICD-10-CM

## 2016-11-04 DIAGNOSIS — E1165 Type 2 diabetes mellitus with hyperglycemia: Secondary | ICD-10-CM | POA: Diagnosis not present

## 2016-11-04 DIAGNOSIS — IMO0002 Reserved for concepts with insufficient information to code with codable children: Secondary | ICD-10-CM

## 2016-11-04 DIAGNOSIS — I10 Essential (primary) hypertension: Secondary | ICD-10-CM | POA: Diagnosis not present

## 2016-11-04 DIAGNOSIS — Z8709 Personal history of other diseases of the respiratory system: Secondary | ICD-10-CM

## 2016-11-04 DIAGNOSIS — D849 Immunodeficiency, unspecified: Secondary | ICD-10-CM

## 2016-11-04 DIAGNOSIS — H6983 Other specified disorders of Eustachian tube, bilateral: Secondary | ICD-10-CM | POA: Diagnosis not present

## 2016-11-04 DIAGNOSIS — J40 Bronchitis, not specified as acute or chronic: Secondary | ICD-10-CM | POA: Diagnosis not present

## 2016-11-04 DIAGNOSIS — J302 Other seasonal allergic rhinitis: Secondary | ICD-10-CM | POA: Diagnosis not present

## 2016-11-04 DIAGNOSIS — D899 Disorder involving the immune mechanism, unspecified: Secondary | ICD-10-CM | POA: Diagnosis not present

## 2016-11-04 DIAGNOSIS — N183 Chronic kidney disease, stage 3 (moderate): Secondary | ICD-10-CM | POA: Diagnosis not present

## 2016-11-04 DIAGNOSIS — J3089 Other allergic rhinitis: Secondary | ICD-10-CM | POA: Diagnosis not present

## 2016-11-04 MED ORDER — AZITHROMYCIN 250 MG PO TABS: ORAL_TABLET | ORAL | 0 refills | Status: DC

## 2016-11-04 MED ORDER — FLUTICASONE PROPIONATE 50 MCG/ACT NA SUSP: 2.0000 | Freq: Every day | NASAL | 0 refills | Status: DC

## 2016-11-04 MED ORDER — QUETIAPINE FUMARATE 25 MG PO TABS: 25.0000 mg | ORAL_TABLET | Freq: Every day | ORAL | 0 refills | Status: DC

## 2016-11-04 MED ORDER — FLUTICASONE FUROATE-VILANTEROL 200-25 MCG/INH IN AEPB: 1.0000 | INHALATION_SPRAY | Freq: Every day | RESPIRATORY_TRACT | 0 refills | Status: DC

## 2016-11-04 MED ORDER — ZOLPIDEM TARTRATE ER 12.5 MG PO TBCR: 12.5000 mg | EXTENDED_RELEASE_TABLET | Freq: Every evening | ORAL | 0 refills | Status: DC | PRN

## 2016-11-04 NOTE — Progress Notes (Signed)
Name: Christine Jensen   MRN: [id number]    DOB: 11-12-1950   Date:11/04/2016       Progress Note  Subjective  Chief Complaint  Chief Complaint  Patient presents with  . Nasal Congestion    Onset- Since May 28, coughing up yellow phlegm and white sputum, fever first couple of days, sugar has been dropping in 60's, has tried coricidin hbp with no relieve.    HPI  URI: symptoms started 2 weeks ago, initially started with rhinorrhea, fever, ear fullness, progressed to sinus pressure, productive cough, chest congestion, wheezing, increase in fatigue, but no SOB  DMII: she is now on Trulicity and Metformin, glucose at night has been going down to 60's. Advised to stop Metformin, get hgbA1C today and continue Trulicity for now, glucose at night should be above 100 before bed. She is also immunosuppressed on prednisone and has polymyalgia rheumatica  Insomnia: she has been on Ambien CR for many years, she goes to bed around 12:30, she takes Ambien around 1 am and it takes hours sometimes to fall asleep, she sleeps in the am's. She called in for an early refill while I was out of town, and Raelyn Ensign, reviewed her records, she is not due for a refill until June 26th, 2018. Explained controlled medication, cannot be filled sooner than due date. She is willing to try Seroquel. Discussed possible side effects. She said she dropped some pills at the [place]   Patient Active Problem List   Diagnosis Date Noted  . Class 1 obesity due to excess calories with serious comorbidity and body mass index (BMI) of 32.0 to 32.9 in adult 06/30/2016  . Action tremor 06/30/2016  . B12 deficiency 05/28/2016  . Perennial allergic rhinitis with seasonal variation 01/15/2016  . Senile purpura ([place]) 01/15/2016  . Polymyalgia rheumatica ([place]) 11/13/2015  . Controlled type 2 diabetes mellitus with stage 3 chronic kidney disease, without long-term current use of insulin ([place]) 11/13/2015  . Osteoarthritis 08/01/2015  .  Psoriatic arthritis ([place]) 08/01/2015  . Absence of sense of taste 02/09/2015  . Paraspinal mass 02/09/2015  . Hypertension, benign 02/09/2015  . Postherpetic neuralgia 02/09/2015  . IBS (irritable bowel syndrome) 12/08/2014  . Insomnia, persistent 12/08/2014  . Chronic back pain 12/08/2014  . Depression, major, recurrent, moderate ([place]) 11/24/2014  . Generalized anxiety disorder 11/24/2014  . Hypercholesteremia 09/15/2014  . Hypothyroid 09/15/2014    Past Surgical History:  Procedure Laterality Date  . ABDOMINAL PERINEAL BOWEL RESECTION    . APPENDECTOMY    . HERNIA REPAIR    . THYROIDECTOMY Left    Dr. Ronnald Collum on 03/06/16    Family History  Problem Relation Age of Onset  . Arthritis Father   . Heart disease Mother   . Alzheimer's disease Mother   . Breast cancer Neg Hx     Social History   Social History  . Marital status: Married    Spouse name: N/A  . Number of children: N/A  . Years of education: N/A   Occupational History  . Not on file.   Social History Main Topics  . Smoking status: Never Smoker  . Smokeless tobacco: Never Used  . Alcohol use 0.0 oz/week     Comment: rarely  . Drug use: No  . Sexual activity: Yes    Partners: Male    Birth control/ protection: None   Other Topics Concern  . Not on file   Social History Narrative   Moved here with husband and son  from [place]         Current Outpatient Prescriptions:  .  aspirin 81 MG tablet, Take 1 tablet by mouth every other day. , Disp: , Rfl:  .  clobetasol (TEMOVATE) 0.05 % external solution, , Disp: , Rfl:  .  Clobetasol Propionate 0.05 % shampoo, , Disp: , Rfl:  .  cyclobenzaprine (FLEXERIL) 10 MG tablet, TAKE 1 TABLET BY MOUTH UP TO THREE TIMES DAILY FOR MUSCLE SPASMS (Patient taking differently: TAKE 1 TABLET BY MOUTH UP TO THREE TIMES DAILY FOR MUSCLE SPASMS AS NEEDED), Disp: 90 tablet, Rfl: 0 .  desvenlafaxine (PRISTIQ) 50 MG 24 hr tablet, Take 1 tablet (50 mg total) by mouth every  morning., Disp: 90 tablet, Rfl: 0 .  dicyclomine (BENTYL) 20 MG tablet, Take 20 mg by mouth 3 (three) times daily., Disp: , Rfl: 4 .  diltiazem (DILACOR XR) 180 MG 24 hr capsule, Take 1 capsule (180 mg total) by mouth daily. For bp, Disp: 90 capsule, Rfl: 1 .  Dulaglutide (TRULICITY) 1.5 SE/8.3TD SOPN, Inject 1.5 mg into the skin once a week., Disp: 12 pen, Rfl: 1 .  folic acid (FOLVITE) 1 MG tablet, Take 1 mg by mouth daily., Disp: , Rfl: 2 .  glucose blood test strip, Use as instructed, Disp: 100 each, Rfl: 12 .  HYDROcodone-acetaminophen (NORCO) 10-325 MG tablet, Take 1 tablet by mouth every 6 (six) hours as needed., Disp: 40 tablet, Rfl: 0 .  JUBLIA 10 % SOLN, , Disp: , Rfl:  .  levothyroxine (SYNTHROID, LEVOTHROID) 112 MCG tablet, Take 1 tablet (112 mcg total) by mouth daily before breakfast., Disp: 90 tablet, Rfl: 1 .  lidocaine (LIDODERM) 5 %, Place 1 patch onto the skin as needed. , Disp: , Rfl: 2 .  losartan-hydrochlorothiazide (HYZAAR) 50-12.5 MG tablet, TAKE 1 TABLET DAILY FOR    BLOOD PRESSURE, Disp: 90 tablet, Rfl: 1 .  metFORMIN (GLUCOPHAGE-XR) 750 MG 24 hr tablet, TAKE 1 TABLET (750 MG TOTAL) BY MOUTH DAILY WITH BREAKFAST., Disp: 90 tablet, Rfl: 1 .  methotrexate (RHEUMATREX) 2.5 MG tablet, TAKE 8 TABLETS ONCE A WEEK, Disp: , Rfl: 2 .  mometasone (ELOCON) 0.1 % lotion, APPLY TO ITCHY EARS TWICE DAILY FOR 12 DAYS. MAY REPEAT AS NEEDED FOR ITCHY EARS, Disp: , Rfl: 10 .  pravastatin (PRAVACHOL) 40 MG tablet, TAKE 1 TABLET DAILY, Disp: 90 tablet, Rfl: 1 .  predniSONE (DELTASONE) 2.5 MG tablet, Take 2.5 mg by mouth every morning. , Disp: , Rfl:  .  pyrithione zinc (HEAD AND SHOULDERS) 1 % shampoo, Apply topically daily as needed for itching., Disp: , Rfl:  .  zolpidem (AMBIEN CR) 12.5 MG CR tablet, Take 1 tablet (12.5 mg total) by mouth at bedtime as needed for sleep., Disp: 7 tablet, Rfl: 0 .  fluticasone (FLONASE) 50 MCG/ACT nasal spray, Place 2 sprays into both nostrils daily. (Patient  not taking: Reported on 11/04/2016), Disp: 16 g, Rfl: 0  Allergies  Allergen Reactions  . Erythromycin Shortness Of Breath  . Macadamia Nut Oil Shortness Of Breath    Oily nuts   . Codeine   . Lexapro [Escitalopram] Other (See Comments)    Lethargy and inefective   . Penicillins   . Sulfa Antibiotics Other (See Comments)    Joints become painful   . Aspartame Rash  . Other Rash    Shoes and Environmental     ROS  Ten systems reviewed and is negative except as mentioned in HPI   Objective  Vitals:   11/04/16 1343  BP: 128/76  Pulse: 96  Resp: 16  Temp: 97.9 F (36.6 C)  TempSrc: Oral  SpO2: 96%  Weight: 196 lb (88.9 kg)  Height: 5\' 6"  (1.676 m)    Body mass index is 31.64 kg/m.  Physical Exam  Constitutional: Patient appears well-developed and well-nourished. Obese No distress.  HEENT: head atraumatic, normocephalic, pupils equal and reactive to light,  neck supple, throat within normal limits, tender during percussion of frontal sinus, boggy turbinates Cardiovascular: Normal rate, regular rhythm and normal heart sounds.  No murmur heard. No BLE edema. Pulmonary/Chest: Effort normal and breath sounds normal. No respiratory distress. Abdominal: Soft.  There is no tenderness. Psychiatric: Patient has a normal mood and affect. behavior is normal. Judgment and thought content normal.  Recent Results (from the past 2160 hour(s))  HM DIABETES EYE EXAM     Status: None   Collection Time: 10/07/16 12:00 AM  Result Value Ref Range   HM Diabetic Eye Exam No Retinopathy No Retinopathy    Comment: Dr. Matilde Sprang, [place]     PHQ2/9: Depression screen [facility] 2/9 08/21/2016 04/22/2016 01/15/2016 12/26/2015 11/13/2015  Decreased Interest 0 3 1 2  0  Down, Depressed, Hopeless 1 3 1 2 3   PHQ - 2 Score 1 6 2 4 3   Altered sleeping 3 3 3 2 2   Tired, decreased energy 2 0 1 1 2   Change in appetite 3 3 3 1  0  Feeling bad or failure about yourself  2 3 1 1  0  Trouble concentrating 0  0 1 0 1  Moving slowly or fidgety/restless 0 0 1 0 0  Suicidal thoughts 0 0 0 0 0  PHQ-9 Score 11 15 12 9 8   Difficult doing work/chores Somewhat difficult Somewhat difficult - Somewhat difficult -    Fall Risk: Fall Risk  04/22/2016 01/15/2016 12/26/2015 11/13/2015 08/14/2015  Falls in the past year? Yes No No No No  Number falls in past yr: 1 - - - -  Injury with Fall? Yes - - - -     Assessment & Plan  1. Recent URI  Likely viral, but she is high risk we will start antibiotics  2. Bronchitis  - azithromycin (ZITHROMAX Z-PAK) 250 MG tablet; Take as directed  Dispense: 6 each; Refill: 0  3. Insomnia, persistent  - QUEtiapine (SEROQUEL) 25 MG tablet; Take 1 tablet (25 mg total) by mouth at bedtime.  Dispense: 30 tablet; Refill: 0 - zolpidem (AMBIEN CR) 12.5 MG CR tablet; Take 1 tablet (12.5 mg total) by mouth at bedtime as needed for sleep.  Dispense: 30 tablet; Refill: 0  4. Uncontrolled type 2 diabetes mellitus with stage 3 chronic kidney disease, without long-term current use of insulin (HCC)  Check hgbA1C today  5. Perennial allergic rhinitis with seasonal variation  - fluticasone (FLONASE) 50 MCG/ACT nasal spray; Place 2 sprays into both nostrils daily.  Dispense: 16 g; Refill: 0  6. Dysfunction of both eustachian tubes  - fluticasone furoate-vilanterol (BREO ELLIPTA) 200-25 MCG/INH AEPB; Inhale 1 puff into the lungs daily.  Dispense: 30 each; Refill: 0  7. Immunosuppression (HCC)  - azithromycin (ZITHROMAX Z-PAK) 250 MG tablet; Take as directed  Dispense: 6 each; Refill: 0

## 2016-11-05 ENCOUNTER — Other Ambulatory Visit: Payer: Self-pay | Admitting: Family Medicine

## 2016-11-05 DIAGNOSIS — J40 Bronchitis, not specified as acute or chronic: Secondary | ICD-10-CM

## 2016-11-05 DIAGNOSIS — D899 Disorder involving the immune mechanism, unspecified: Secondary | ICD-10-CM

## 2016-11-05 DIAGNOSIS — D849 Immunodeficiency, unspecified: Secondary | ICD-10-CM

## 2016-11-05 LAB — COMPLETE METABOLIC PANEL WITH GFR
ALBUMIN: 3.9 g/dL (ref 3.6–5.1)
ALK PHOS: 63 U/L (ref 33–130)
ALT: 31 U/L — ABNORMAL HIGH (ref 6–29)
AST: 24 U/L (ref 10–35)
BILIRUBIN TOTAL: 0.5 mg/dL (ref 0.2–1.2)
BUN: 22 mg/dL (ref 7–25)
CALCIUM: 9.6 mg/dL (ref 8.6–10.4)
CO2: 24 mmol/L (ref 20–31)
Chloride: 103 mmol/L (ref 98–110)
Creat: 1.04 mg/dL — ABNORMAL HIGH (ref 0.50–0.99)
GFR, EST NON AFRICAN AMERICAN: 56 mL/min — AB (ref >=60)
GFR, Est African American: 64 mL/min (ref >=60)
GLUCOSE: 149 mg/dL — AB (ref 65–99)
POTASSIUM: 3.7 mmol/L (ref 3.5–5.3)
SODIUM: 140 mmol/L (ref 135–146)
TOTAL PROTEIN: 6.5 g/dL (ref 6.1–8.1)

## 2016-11-05 LAB — HEMOGLOBIN A1C
HEMOGLOBIN A1C: 7 % — AB (ref <5.7)
Mean Plasma Glucose: 154 mg/dL

## 2016-11-05 MED ORDER — AZITHROMYCIN 250 MG PO TABS: ORAL_TABLET | ORAL | 0 refills | Status: DC

## 2016-11-10 ENCOUNTER — Ambulatory Visit: Payer: Self-pay | Admitting: Family Medicine

## 2016-11-24 ENCOUNTER — Ambulatory Visit (INDEPENDENT_AMBULATORY_CARE_PROVIDER_SITE_OTHER): Payer: Medicare Other

## 2016-11-24 ENCOUNTER — Encounter: Payer: Self-pay | Admitting: Family Medicine

## 2016-11-24 ENCOUNTER — Ambulatory Visit (INDEPENDENT_AMBULATORY_CARE_PROVIDER_SITE_OTHER): Payer: Medicare Other | Admitting: Family Medicine

## 2016-11-24 VITALS — BP 128/72 | HR 68 | Temp 97.3°F | Ht 66.0 in | Wt 195.3 lb

## 2016-11-24 VITALS — BP 128/72 | HR 68 | Temp 97.3°F | Resp 16 | Ht 66.0 in | Wt 195.2 lb

## 2016-11-24 DIAGNOSIS — G4700 Insomnia, unspecified: Secondary | ICD-10-CM

## 2016-11-24 DIAGNOSIS — F331 Major depressive disorder, recurrent, moderate: Secondary | ICD-10-CM

## 2016-11-24 DIAGNOSIS — E1122 Type 2 diabetes mellitus with diabetic chronic kidney disease: Secondary | ICD-10-CM

## 2016-11-24 DIAGNOSIS — M353 Polymyalgia rheumatica: Secondary | ICD-10-CM

## 2016-11-24 DIAGNOSIS — Z0000 Encounter for general adult medical examination without abnormal findings: Secondary | ICD-10-CM

## 2016-11-24 DIAGNOSIS — N183 Chronic kidney disease, stage 3 (moderate): Secondary | ICD-10-CM | POA: Diagnosis not present

## 2016-11-24 DIAGNOSIS — D899 Disorder involving the immune mechanism, unspecified: Secondary | ICD-10-CM | POA: Diagnosis not present

## 2016-11-24 DIAGNOSIS — D849 Immunodeficiency, unspecified: Secondary | ICD-10-CM

## 2016-11-24 DIAGNOSIS — D692 Other nonthrombocytopenic purpura: Secondary | ICD-10-CM

## 2016-11-24 MED ORDER — ZOLPIDEM TARTRATE ER 12.5 MG PO TBCR: 12.5000 mg | EXTENDED_RELEASE_TABLET | Freq: Every evening | ORAL | 0 refills | Status: DC | PRN

## 2016-11-24 NOTE — Patient Instructions (Signed)
Christine Jensen , Thank you for taking time to come for your Medicare Wellness Visit. I appreciate your ongoing commitment to your health goals. Please review the following plan we discussed and let me know if I can assist you in the future.   Screening recommendations/referrals: Colonoscopy: completed 10/10/14, due 09/2024 Mammogram: completed 09/19/16, due 08/2017 Bone Density: completed 08/19/10 Recommended yearly ophthalmology/optometry visit for glaucoma screening and checkup Recommended yearly dental visit for hygiene and checkup  Vaccinations: Influenza vaccine: due 01/2017 Pneumococcal vaccine: completed series Tdap vaccine: completed 07/20/12, due 06/2022 Shingles vaccine: completed 01/03/10  Advanced directives: Please bring a copy of your POA (Power of [place]) and/or Living Will to your next appointment.   Conditions/risks identified: Obesity; Recommend increasing water intake to 4-6 glasses daily.    Next appointment: None, need to schedule 1 year AWV.   Preventive Care 67 Years and Older, Female Preventive care refers to lifestyle choices and visits with your health care provider that can promote health and wellness. What does preventive care include?  A yearly physical exam. This is also called an annual well check.  Dental exams once or twice a year.  Routine eye exams. Ask your health care provider how often you should have your eyes checked.  Personal lifestyle choices, including:  Daily care of your teeth and gums.  Regular physical activity.  Eating a healthy diet.  Avoiding tobacco and drug use.  Limiting alcohol use.  Practicing safe sex.  Taking low-dose aspirin every day.  Taking vitamin and mineral supplements as recommended by your health care provider. What happens during an annual well check? The services and screenings done by your health care provider during your annual well check will depend on your age, overall health, lifestyle risk  factors, and family history of disease. Counseling  Your health care provider may ask you questions about your:  Alcohol use.  Tobacco use.  Drug use.  Emotional well-being.  Home and relationship well-being.  Sexual activity.  Eating habits.  History of falls.  Memory and ability to understand (cognition).  Work and work Statistician.  Reproductive health. Screening  You may have the following tests or measurements:  Height, weight, and BMI.  Blood pressure.  Lipid and cholesterol levels. These may be checked every 5 years, or more frequently if you are over 65 years old.  Skin check.  Lung cancer screening. You may have this screening every year starting at age 67 if you have a 30-pack-year history of smoking and currently smoke or have quit within the past 15 years.  Fecal occult blood test (FOBT) of the stool. You may have this test every year starting at age 67.  Flexible sigmoidoscopy or colonoscopy. You may have a sigmoidoscopy every 5 years or a colonoscopy every 10 years starting at age 70.  Hepatitis C blood test.  Hepatitis B blood test.  Sexually transmitted disease (STD) testing.  Diabetes screening. This is done by checking your blood sugar (glucose) after you have not eaten for a while (fasting). You may have this done every 1-3 years.  Bone density scan. This is done to screen for osteoporosis. You may have this done starting at age 67.  Mammogram. This may be done every 1-2 years. Talk to your health care provider about how often you should have regular mammograms. Talk with your health care provider about your test results, treatment options, and if necessary, the need for more tests. Vaccines  Your health care provider may recommend certain vaccines, such  as:  Influenza vaccine. This is recommended every year.  Tetanus, diphtheria, and acellular pertussis (Tdap, Td) vaccine. You may need a Td booster every 10 years.  Zoster vaccine. You  may need this after age 72.  Pneumococcal 13-valent conjugate (PCV13) vaccine. One dose is recommended after age 65.  Pneumococcal polysaccharide (PPSV23) vaccine. One dose is recommended after age 60. Talk to your health care provider about which screenings and vaccines you need and how often you need them. This information is not intended to replace advice given to you by your health care provider. Make sure you discuss any questions you have with your health care provider. Document Released: 06/08/2015 Document Revised: 01/30/2016 Document Reviewed: 03/13/2015 Elsevier Interactive Patient Education  2017 [place] Prevention in the Home Falls can cause injuries. They can happen to people of all ages. There are many things you can do to make your home safe and to help prevent falls. What can I do on the outside of my home?  Regularly fix the edges of walkways and driveways and fix any cracks.  Remove anything that might make you trip as you walk through a door, such as a raised step or threshold.  Trim any bushes or trees on the path to your home.  Use bright outdoor lighting.  Clear any walking paths of anything that might make someone trip, such as rocks or tools.  Regularly check to see if handrails are loose or broken. Make sure that both sides of any steps have handrails.  Any raised decks and porches should have guardrails on the edges.  Have any leaves, snow, or ice cleared regularly.  Use sand or salt on walking paths during winter.  Clean up any spills in your garage right away. This includes oil or grease spills. What can I do in the bathroom?  Use night lights.  Install grab bars by the toilet and in the tub and shower. Do not use towel bars as grab bars.  Use non-skid mats or decals in the tub or shower.  If you need to sit down in the shower, use a plastic, non-slip stool.  Keep the floor dry. Clean up any water that spills on the floor as soon as  it happens.  Remove soap buildup in the tub or shower regularly.  Attach bath mats securely with double-sided non-slip rug tape.  Do not have throw rugs and other things on the floor that can make you trip. What can I do in the bedroom?  Use night lights.  Make sure that you have a light by your bed that is easy to reach.  Do not use any sheets or blankets that are too big for your bed. They should not hang down onto the floor.  Have a firm chair that has side arms. You can use this for support while you get dressed.  Do not have throw rugs and other things on the floor that can make you trip. What can I do in the kitchen?  Clean up any spills right away.  Avoid walking on wet floors.  Keep items that you use a lot in easy-to-reach places.  If you need to reach something above you, use a strong step stool that has a grab bar.  Keep electrical cords out of the way.  Do not use floor polish or wax that makes floors slippery. If you must use wax, use non-skid floor wax.  Do not have throw rugs and other things on the  floor that can make you trip. What can I do with my stairs?  Do not leave any items on the stairs.  Make sure that there are handrails on both sides of the stairs and use them. Fix handrails that are broken or loose. Make sure that handrails are as long as the stairways.  Check any carpeting to make sure that it is firmly attached to the stairs. Fix any carpet that is loose or worn.  Avoid having throw rugs at the top or bottom of the stairs. If you do have throw rugs, attach them to the floor with carpet tape.  Make sure that you have a light switch at the top of the stairs and the bottom of the stairs. If you do not have them, ask someone to add them for you. What else can I do to help prevent falls?  Wear shoes that:  Do not have high heels.  Have rubber bottoms.  Are comfortable and fit you well.  Are closed at the toe. Do not wear sandals.  If  you use a stepladder:  Make sure that it is fully opened. Do not climb a closed stepladder.  Make sure that both sides of the stepladder are locked into place.  Ask someone to hold it for you, if possible.  Clearly mark and make sure that you can see:  Any grab bars or handrails.  First and last steps.  Where the edge of each step is.  Use tools that help you move around (mobility aids) if they are needed. These include:  Canes.  Walkers.  Scooters.  Crutches.  Turn on the lights when you go into a dark area. Replace any light bulbs as soon as they burn out.  Set up your furniture so you have a clear path. Avoid moving your furniture around.  If any of your floors are uneven, fix them.  If there are any pets around you, be aware of where they are.  Review your medicines with your doctor. Some medicines can make you feel dizzy. This can increase your chance of falling. Ask your doctor what other things that you can do to help prevent falls. This information is not intended to replace advice given to you by your health care provider. Make sure you discuss any questions you have with your health care provider. Document Released: 03/08/2009 Document Revised: 10/18/2015 Document Reviewed: 06/16/2014 Elsevier Interactive Patient Education  2017 [employer].

## 2016-11-24 NOTE — Progress Notes (Signed)
Name: Christine Jensen   MRN: [id number]    DOB: [birth year]   Date:11/24/2016       Progress Note  Subjective  Chief Complaint  Chief Complaint  Patient presents with  . Medicare Wellness    HPI  DMII: she is now on Trulicity only, off Metformin, doing well, last hgbA1C 7.0%, he denies polyphagia, polydipsia or polyuria. Weight is stable. . CKI on ARB.   Insomnia: she has been on Ambien CR for many years, she goes to bed around 12:30, she takes Ambien around 1 am and it takes hours sometimes to fall asleep, she sleeps in the am's. She tried Seroquel and Trazodone in the past, and did not work, explained controlled medication, no early fills.   HTN: doing well, no chest pain, palpitation, tolerating medication.  Chronic pain: still has pain, pain level 4/10 on spine, other joints doing well, weaning off prednisone and will see Rheumatologist tomorrow  Depression/GAD: She is feeling better since we changed to Pristiq back in 05/2016. She has failed Lexapro and Cymbalta. She has noticed that she is more patient with her husband and less frustrated, but still has fatigue - mostly from rheumatological problems. Denies suicidal thoughts and ideation. She is planning on moving back to [place] or [place]  B12 deficiency: explained that may in part be causing some of her fatigue, she takes Metformin and does not eat meat. We will continue monthly injections, not due for injection today.    Patient Active Problem List   Diagnosis Date Noted  . Class 1 obesity due to excess calories with serious comorbidity and body mass index (BMI) of 32.0 to 32.9 in adult 06/30/2016  . Action tremor 06/30/2016  . B12 deficiency 05/28/2016  . Perennial allergic rhinitis with seasonal variation 01/15/2016  . Senile purpura ([place]) 01/15/2016  . Polymyalgia rheumatica ([place]) 11/13/2015  . Controlled type 2 diabetes mellitus with stage 3 chronic kidney disease, without long-term current use of insulin ([place]) 11/13/2015  .  Osteoarthritis 08/01/2015  . Psoriatic arthritis ([place]) 08/01/2015  . Absence of sense of taste 02/09/2015  . Paraspinal mass 02/09/2015  . Hypertension, benign 02/09/2015  . Postherpetic neuralgia 02/09/2015  . IBS (irritable bowel syndrome) 12/08/2014  . Insomnia, persistent 12/08/2014  . Chronic back pain 12/08/2014  . Depression, major, recurrent, moderate ([place]) 11/24/2014  . Generalized anxiety disorder 11/24/2014  . Hypercholesteremia 09/15/2014  . Hypothyroid 09/15/2014    Past Surgical History:  Procedure Laterality Date  . ABDOMINAL PERINEAL BOWEL RESECTION    . APPENDECTOMY    . HERNIA REPAIR    . THYROIDECTOMY Left    Dr. Ronnald Collum on 03/06/16    Family History  Problem Relation Age of Onset  . Arthritis Father   . Heart disease Mother   . Alzheimer's disease Mother   . Breast cancer Neg Hx     Social History   Social History  . Marital status: Married    Spouse name: N/A  . Number of children: N/A  . Years of education: N/A   Occupational History  . Not on file.   Social History Main Topics  . Smoking status: Never Smoker  . Smokeless tobacco: Never Used  . Alcohol use 0.0 oz/week     Comment: rarely  . Drug use: No  . Sexual activity: Yes    Partners: Male    Birth control/ protection: None   Other Topics Concern  . Not on file   Social History Narrative   Moved here with  husband and son from [place]         Current Outpatient Prescriptions:  .  aspirin 81 MG tablet, Take 1 tablet by mouth every other day. , Disp: , Rfl:  .  clobetasol (TEMOVATE) 0.05 % external solution, , Disp: , Rfl:  .  Clobetasol Propionate 0.05 % shampoo, , Disp: , Rfl:  .  cyclobenzaprine (FLEXERIL) 10 MG tablet, TAKE 1 TABLET BY MOUTH UP TO THREE TIMES DAILY FOR MUSCLE SPASMS (Patient taking differently: TAKE 1 TABLET BY MOUTH UP TO THREE TIMES DAILY FOR MUSCLE SPASMS AS NEEDED), Disp: 90 tablet, Rfl: 0 .  desvenlafaxine (PRISTIQ) 50 MG 24 hr tablet, Take 1 tablet (50  mg total) by mouth every morning., Disp: 90 tablet, Rfl: 0 .  dicyclomine (BENTYL) 20 MG tablet, Take 20 mg by mouth 3 (three) times daily., Disp: , Rfl: 4 .  diltiazem (DILACOR XR) 180 MG 24 hr capsule, Take 1 capsule (180 mg total) by mouth daily. For bp, Disp: 90 capsule, Rfl: 1 .  Dulaglutide (TRULICITY) 1.5 ZO/1.0RU SOPN, Inject 1.5 mg into the skin once a week., Disp: 12 pen, Rfl: 1 .  fluticasone furoate-vilanterol (BREO ELLIPTA) 200-25 MCG/INH AEPB, Inhale 1 puff into the lungs daily., Disp: 30 each, Rfl: 0 .  folic acid (FOLVITE) 1 MG tablet, Take 1 mg by mouth daily., Disp: , Rfl: 2 .  glucose blood test strip, Use as instructed, Disp: 100 each, Rfl: 12 .  HYDROcodone-acetaminophen (NORCO) 10-325 MG tablet, Take 1 tablet by mouth every 6 (six) hours as needed., Disp: 40 tablet, Rfl: 0 .  JUBLIA 10 % SOLN, , Disp: , Rfl:  .  levothyroxine (SYNTHROID, LEVOTHROID) 112 MCG tablet, Take 1 tablet (112 mcg total) by mouth daily before breakfast., Disp: 90 tablet, Rfl: 1 .  lidocaine (LIDODERM) 5 %, Place 1 patch onto the skin as needed. , Disp: , Rfl: 2 .  losartan-hydrochlorothiazide (HYZAAR) 50-12.5 MG tablet, TAKE 1 TABLET DAILY FOR    BLOOD PRESSURE, Disp: 90 tablet, Rfl: 1 .  metFORMIN (GLUCOPHAGE-XR) 750 MG 24 hr tablet, TAKE 1 TABLET (750 MG TOTAL) BY MOUTH DAILY WITH BREAKFAST., Disp: 90 tablet, Rfl: 1 .  methotrexate (RHEUMATREX) 2.5 MG tablet, TAKE 8 TABLETS ONCE A WEEK, Disp: , Rfl: 2 .  pravastatin (PRAVACHOL) 40 MG tablet, TAKE 1 TABLET DAILY, Disp: 90 tablet, Rfl: 1 .  predniSONE (DELTASONE) 2.5 MG tablet, Take 2.5 mg by mouth. , Disp: , Rfl:  .  pyrithione zinc (HEAD AND SHOULDERS) 1 % shampoo, Apply topically daily as needed for itching., Disp: , Rfl:  .  zolpidem (AMBIEN CR) 12.5 MG CR tablet, Take 1 tablet (12.5 mg total) by mouth at bedtime as needed for sleep., Disp: 90 tablet, Rfl: 0  Allergies  Allergen Reactions  . Erythromycin Shortness Of Breath  . Macadamia Nut Oil  Shortness Of Breath    Oily nuts   . Codeine   . Lexapro [Escitalopram] Other (See Comments)    Lethargy and inefective   . Penicillins   . Sulfa Antibiotics Other (See Comments)    Joints become painful   . Aspartame Rash  . Other Rash    Shoes and Environmental     ROS  Constitutional: Negative for fever or weight change.  Respiratory: Negative for cough and shortness of breath.   Cardiovascular: Negative for chest pain or palpitations.  Gastrointestinal: Negative for abdominal pain, no bowel changes.  Musculoskeletal: Negative for gait problem or joint swelling.  Skin: Negative  for rash.  Neurological: Negative for dizziness or headache.  No other specific complaints in a complete review of systems (except as listed in HPI above).  Objective  Vitals:   11/24/16 1507  BP: 128/72  Pulse: 68  Resp: 16  Temp: 97.3 F (36.3 C)  SpO2: 97%  Weight: 195 lb 3 oz (88.5 kg)  Height: 5\' 6"  (1.676 m)    Body mass index is 31.5 kg/m.  Physical Exam  Constitutional: Patient appears well-developed and well-nourished. Obese  No distress.  HEENT: head atraumatic, normocephalic, pupils equal and reactive to light, neck supple, throat within normal limits Cardiovascular: Normal rate, regular rhythm and normal heart sounds.  No murmur heard. No BLE edema. Pulmonary/Chest: Effort normal and breath sounds normal. No respiratory distress. Abdominal: Soft.  There is no tenderness. Skin: purpura arms- reassurance Psychiatric: Patient has a normal mood and affect. behavior is normal. Judgment and thought content normal.  Recent Results (from the past 2160 hour(s))  HM DIABETES EYE EXAM     Status: None   Collection Time: 10/07/16 12:00 AM  Result Value Ref Range   HM Diabetic Eye Exam No Retinopathy No Retinopathy    Comment: Dr. Matilde Sprang, [place] [facility]     Status: Abnormal   Collection Time: 11/04/16  2:35 PM  Result Value Ref Range   Sodium 140  135 - 146 mmol/L   Potassium 3.7 3.5 - 5.3 mmol/L   Chloride 103 98 - 110 mmol/L   CO2 24 20 - 31 mmol/L   Glucose, Bld 149 (H) 65 - 99 mg/dL   BUN 22 7 - 25 mg/dL   Creat 1.04 (H) 0.50 - 0.99 mg/dL    Comment:   For patients > or = 67 years of age: The upper reference limit for Creatinine is approximately 13% higher for people identified as African-American.      Total Bilirubin 0.5 0.2 - 1.2 mg/dL   Alkaline Phosphatase 63 33 - 130 U/L   AST 24 10 - 35 U/L   ALT 31 (H) 6 - 29 U/L   Total Protein 6.5 6.1 - 8.1 g/dL   Albumin 3.9 3.6 - 5.1 g/dL   Calcium 9.6 8.6 - 10.4 mg/dL   GFR, Est African American 64 >=60 mL/min   GFR, Est Non African American 56 (L) >=60 mL/min  Hemoglobin A1c     Status: Abnormal   Collection Time: 11/04/16  2:35 PM  Result Value Ref Range   Hgb A1c MFr Bld 7.0 (H) <5.7 %    Comment:   For someone without known diabetes, a hemoglobin A1c value of 6.5% or greater indicates that they may have diabetes and this should be confirmed with a follow-up test.   For someone with known diabetes, a value <7% indicates that their diabetes is well controlled and a value greater than or equal to 7% indicates suboptimal control. A1c targets should be individualized based on duration of diabetes, age, comorbid conditions, and other considerations.   Currently, no consensus exists for use of hemoglobin A1c for diagnosis of diabetes for children.      Mean Plasma Glucose 154 mg/dL      PHQ2/9: Depression screen [facility] 2/9 11/24/2016 08/21/2016 04/22/2016 01/15/2016 12/26/2015  Decreased Interest 1 0 3 1 2   Down, Depressed, Hopeless 3 1 3 1 2   PHQ - 2 Score 4 1 6 2 4   Altered sleeping 3 3 3 3 2   Tired, decreased energy 0  2 0 1 1  Change in appetite 3 3 3 3 1   Feeling bad or failure about yourself  1 2 3 1 1   Trouble concentrating 0 0 0 1 0  Moving slowly or fidgety/restless 0 0 0 1 0  Suicidal thoughts 0 0 0 0 0  PHQ-9 Score 11 11 15 12 9   Difficult doing  work/chores Somewhat difficult Somewhat difficult Somewhat difficult - Somewhat difficult     Fall Risk: Fall Risk  11/24/2016 04/22/2016 01/15/2016 12/26/2015 11/13/2015  Falls in the past year? No Yes No No No  Number falls in past yr: - 1 - - -  Injury with Fall? - Yes - - -  Risk for fall due to : Other (Comment) - - - -  Risk for fall due to (comments): back pain and pain medicine  - - - -     Assessment & Plan  1. Insomnia, persistent  - zolpidem (AMBIEN CR) 12.5 MG CR tablet; Take 1 tablet (12.5 mg total) by mouth at bedtime as needed for sleep.  Dispense: 90 tablet; Refill: 0  2. Depression, major, recurrent, moderate ([place])  She states she is doing better on Pristiq even though she feels better, trying to move back up [place]  3. Polymyalgia rheumatica ([facility])  Going down on prednisone dose  4. Senile purpura ([facility])  stable  5. Controlled type 2 diabetes mellitus with stage 3 chronic kidney disease, without long-term current use of insulin ([facility])  Doing well on Trulicity , no side effects of medication, stopped Metformin and hypoglycemia has resolved  6. Immunosuppression ([place])  From medication

## 2016-11-24 NOTE — Progress Notes (Signed)
Subjective:   Christine Jensen is a 66 y.o. female who presents for an Initial Medicare Annual Wellness Visit.  Review of Systems    N/A  Cardiac Risk Factors include: advanced age (>57men, >69 women);diabetes mellitus;dyslipidemia;hypertension;obesity (BMI >30kg/m2)     Objective:    Today's Vitals   11/24/16 1432 11/24/16 1439  BP: 128/72   Pulse: 68   Temp: 97.3 F (36.3 C)   TempSrc: Oral   Weight: 195 lb 4.8 oz (88.6 kg)   Height: 5\' 6"  (1.676 m)   PainSc: 4  4   PainLoc: Back    Body mass index is 31.52 kg/m.   Current Medications (verified) Outpatient Encounter Prescriptions as of 11/24/2016  Medication Sig  . aspirin 81 MG tablet Take 1 tablet by mouth every other day.   . clobetasol (TEMOVATE) 0.05 % external solution   . Clobetasol Propionate 0.05 % shampoo   . cyclobenzaprine (FLEXERIL) 10 MG tablet TAKE 1 TABLET BY MOUTH UP TO THREE TIMES DAILY FOR MUSCLE SPASMS (Patient taking differently: TAKE 1 TABLET BY MOUTH UP TO THREE TIMES DAILY FOR MUSCLE SPASMS AS NEEDED)  . desvenlafaxine (PRISTIQ) 50 MG 24 hr tablet Take 1 tablet (50 mg total) by mouth every morning.  . dicyclomine (BENTYL) 20 MG tablet Take 20 mg by mouth 3 (three) times daily.  Marland Kitchen diltiazem (DILACOR XR) 180 MG 24 hr capsule Take 1 capsule (180 mg total) by mouth daily. For bp  . Dulaglutide (TRULICITY) 1.5 WJ/1.9JY SOPN Inject 1.5 mg into the skin once a week.  . fluticasone furoate-vilanterol (BREO ELLIPTA) 200-25 MCG/INH AEPB Inhale 1 puff into the lungs daily.  . folic acid (FOLVITE) 1 MG tablet Take 1 mg by mouth daily.  Marland Kitchen glucose blood test strip Use as instructed  . HYDROcodone-acetaminophen (NORCO) 10-325 MG tablet Take 1 tablet by mouth every 6 (six) hours as needed.  . JUBLIA 10 % SOLN   . levothyroxine (SYNTHROID, LEVOTHROID) 112 MCG tablet Take 1 tablet (112 mcg total) by mouth daily before breakfast.  . lidocaine (LIDODERM) 5 % Place 1 patch onto the skin as needed.   Marland Kitchen  losartan-hydrochlorothiazide (HYZAAR) 50-12.5 MG tablet TAKE 1 TABLET DAILY FOR    BLOOD PRESSURE  . metFORMIN (GLUCOPHAGE-XR) 750 MG 24 hr tablet TAKE 1 TABLET (750 MG TOTAL) BY MOUTH DAILY WITH BREAKFAST.  . methotrexate (RHEUMATREX) 2.5 MG tablet TAKE 8 TABLETS ONCE A WEEK  . pravastatin (PRAVACHOL) 40 MG tablet TAKE 1 TABLET DAILY  . predniSONE (DELTASONE) 2.5 MG tablet Take 2.5 mg by mouth.   . pyrithione zinc (HEAD AND SHOULDERS) 1 % shampoo Apply topically daily as needed for itching.  . zolpidem (AMBIEN CR) 12.5 MG CR tablet Take 1 tablet (12.5 mg total) by mouth at bedtime as needed for sleep.  Marland Kitchen QUEtiapine (SEROQUEL) 25 MG tablet Take 1 tablet (25 mg total) by mouth at bedtime. (Patient not taking: Reported on 11/24/2016)  . [DISCONTINUED] azithromycin (ZITHROMAX Z-PAK) 250 MG tablet Take as directed  . [DISCONTINUED] fluticasone (FLONASE) 50 MCG/ACT nasal spray Place 2 sprays into both nostrils daily.  . [DISCONTINUED] mometasone (ELOCON) 0.1 % lotion APPLY TO ITCHY EARS TWICE DAILY FOR 12 DAYS. MAY REPEAT AS NEEDED FOR ITCHY EARS   No facility-administered encounter medications on file as of 11/24/2016.     Allergies (verified) Erythromycin; Macadamia nut oil; Codeine; Lexapro [escitalopram]; Penicillins; Sulfa antibiotics; Aspartame; and Other   History: Past Medical History:  Diagnosis Date  . Allergy   . Anxiety   .  Depression   . Diabetes ([place])   . Diabetes mellitus without complication ([place])   . Hypercholesteremia   . Hypertension   . Hypothyroidism   . IBS (irritable bowel syndrome)    Past Surgical History:  Procedure Laterality Date  . ABDOMINAL PERINEAL BOWEL RESECTION    . APPENDECTOMY    . HERNIA REPAIR    . THYROIDECTOMY Left    Dr. Ronnald Collum on 03/06/16   Family History  Problem Relation Age of Onset  . Arthritis Father   . Heart disease Mother   . Alzheimer's disease Mother   . Breast cancer Neg Hx    Social History   Occupational History  . Not  on file.   Social History Main Topics  . Smoking status: Never Smoker  . Smokeless tobacco: Never Used  . Alcohol use 0.0 oz/week     Comment: rarely  . Drug use: No  . Sexual activity: Yes    Partners: Male    Birth control/ protection: None    Tobacco Counseling Counseling given: Not Answered   Activities of Daily Living In your present state of health, do you have any difficulty performing the following activities: 11/24/2016 04/22/2016  Hearing? N N  Vision? N Y  Difficulty concentrating or making decisions? N N  Walking or climbing stairs? Y Y  Dressing or bathing? N N  Doing errands, shopping? N N  Preparing Food and eating ? N -  Using the Toilet? N -  In the past six months, have you accidently leaked urine? N -  Do you have problems with loss of bowel control? N -  Managing your Medications? N -  Managing your Finances? N -  Housekeeping or managing your Housekeeping? N -  Some recent data might be hidden    Immunizations and Health Maintenance Immunization History  Administered Date(s) Administered  . Influenza, High Dose Seasonal PF 03/13/2016  . Influenza-Unspecified 01/15/2015  . Pneumococcal Conjugate-13 02/09/2015  . Pneumococcal Polysaccharide-23 10/04/2009, 04/22/2016  . Tdap 07/20/2012  . Zoster 01/03/2010   There are no preventive care reminders to display for this patient.  Patient Care Team: Steele Sizer, MD as PCP - General (Family Medicine) Vladimir Crofts, MD as Consulting Physician (Neurology) Miguel Dibble as Counselor [facility]) Valinda Party, MD as Consulting Physician (Rheumatology) Jannet Mantis, MD as Consulting Physician (Dermatology) Idelle Leech, OD as Consulting Physician (Optometry) Beverly Gust, MD as Consulting Physician (Otolaryngology)  Indicate any recent Medical Services you may have received from other than [facility] providers in the past year (date may be approximate).     Assessment:   This is  a routine wellness examination for Christine Jensen.   Hearing/Vision screen Vision Screening Comments: Pt sees Dr Matilde Sprang for vision checks. Pt had her first exam this year and will follow up in 1 year.   Dietary issues and exercise activities discussed: Current Exercise Habits: Structured exercise class, Type of exercise: Other - see comments;walking (water aerobics), Time (Minutes): 30 (to 1 hour), Frequency (Times/Week): 7, Weekly Exercise (Minutes/Week): 210, Intensity: Mild  Goals    . Increase water intake          Recommend increasing water intake to 4-6 glasses daily.       Depression Screen PHQ 2/9 Scores 11/24/2016 08/21/2016 04/22/2016 01/15/2016 12/26/2015 11/13/2015 08/14/2015  PHQ - 2 Score 4 1 6 2 4 3  0  PHQ- 9 Score 11 11 15 12 9 8  -  Exception Documentation - - - - - - -  Not completed - - - - - - -    Fall Risk Fall Risk  11/24/2016 04/22/2016 01/15/2016 12/26/2015 11/13/2015  Falls in the past year? No Yes No No No  Number falls in past yr: - 1 - - -  Injury with Fall? - Yes - - -  Risk for fall due to : Other (Comment) - - - -  Risk for fall due to (comments): back pain and pain medicine  - - - -    Cognitive Function:     6CIT Screen 11/24/2016  What Year? 0 points  What month? 0 points  What time? 0 points  Count back from 20 0 points  Months in reverse 4 points  Repeat phrase 0 points  Total Score 4    Screening Tests Health Maintenance  Topic Date Due  . INFLUENZA VACCINE  12/24/2016  . FOOT EXAM  04/22/2017  . HEMOGLOBIN A1C  05/06/2017  . OPHTHALMOLOGY EXAM  10/29/2017  . MAMMOGRAM  09/19/2018  . TETANUS/TDAP  07/20/2022  . COLONOSCOPY  08/16/2024  . DEXA SCAN  Completed  . Hepatitis C Screening  Completed  . PNA vac Low Risk Adult  Completed      Plan:  I have personally reviewed and addressed the Medicare Annual Wellness questionnaire and have noted the following in the patient's chart:  A. Medical and social history B. Use of alcohol, tobacco or illicit  drugs  C. Current medications and supplements D. Functional ability and status E.  Nutritional status F.  Physical activity G. Advance directives H. List of other physicians I.  Hospitalizations, surgeries, and ER visits in previous 12 months J.  [place] such as hearing and vision if needed, cognitive and depression L. Referrals and appointments - none  In addition, I have reviewed and discussed with patient certain preventive protocols, quality metrics, and best practice recommendations. A written personalized care plan for preventive services as well as general preventive health recommendations were provided to patient.  See attached scanned questionnaire for additional information.   Signed,  Fabio Neighbors, LPN Nurse Health Advisor   MD Recommendations: None.  I have reviewed this encounter including the documentation in this note and/or discussed this patient with the provider, Fabio Neighbors FNP, NP-C. I am certifying that I agree with the content of this note as supervising physician.  Steele Sizer, MD [facility] 11/24/2016, 4:58 PM

## 2016-11-25 DIAGNOSIS — M353 Polymyalgia rheumatica: Secondary | ICD-10-CM | POA: Diagnosis not present

## 2016-11-25 DIAGNOSIS — L4050 Arthropathic psoriasis, unspecified: Secondary | ICD-10-CM | POA: Diagnosis not present

## 2016-11-25 DIAGNOSIS — L409 Psoriasis, unspecified: Secondary | ICD-10-CM | POA: Diagnosis not present

## 2016-11-25 DIAGNOSIS — M1990 Unspecified osteoarthritis, unspecified site: Secondary | ICD-10-CM | POA: Diagnosis not present

## 2016-12-01 ENCOUNTER — Other Ambulatory Visit: Payer: Self-pay | Admitting: Family Medicine

## 2016-12-01 DIAGNOSIS — G4700 Insomnia, unspecified: Secondary | ICD-10-CM

## 2016-12-09 ENCOUNTER — Ambulatory Visit (INDEPENDENT_AMBULATORY_CARE_PROVIDER_SITE_OTHER): Payer: Medicare Other

## 2016-12-09 DIAGNOSIS — E538 Deficiency of other specified B group vitamins: Secondary | ICD-10-CM

## 2016-12-09 MED ORDER — CYANOCOBALAMIN 1000 MCG/ML IJ SOLN
1000.0000 ug | Freq: Once | INTRAMUSCULAR | Status: AC
  Administered 2016-12-09: 1000 ug via INTRAMUSCULAR

## 2016-12-11 DIAGNOSIS — Z600 Problems of adjustment to life-cycle transitions: Secondary | ICD-10-CM | POA: Diagnosis not present

## 2016-12-11 DIAGNOSIS — F411 Generalized anxiety disorder: Secondary | ICD-10-CM | POA: Diagnosis not present

## 2016-12-11 DIAGNOSIS — F331 Major depressive disorder, recurrent, moderate: Secondary | ICD-10-CM | POA: Diagnosis not present

## 2016-12-11 NOTE — Progress Notes (Signed)
Patient received B12 Injection

## 2016-12-15 ENCOUNTER — Encounter: Payer: Self-pay | Admitting: Family Medicine

## 2016-12-25 DIAGNOSIS — F331 Major depressive disorder, recurrent, moderate: Secondary | ICD-10-CM | POA: Diagnosis not present

## 2016-12-25 DIAGNOSIS — Z600 Problems of adjustment to life-cycle transitions: Secondary | ICD-10-CM | POA: Diagnosis not present

## 2016-12-25 DIAGNOSIS — F411 Generalized anxiety disorder: Secondary | ICD-10-CM | POA: Diagnosis not present

## 2016-12-26 ENCOUNTER — Other Ambulatory Visit: Payer: Self-pay | Admitting: Family Medicine

## 2016-12-26 DIAGNOSIS — G4700 Insomnia, unspecified: Secondary | ICD-10-CM

## 2016-12-26 NOTE — Telephone Encounter (Signed)
Patient requesting ref[facility]

## 2016-12-27 ENCOUNTER — Other Ambulatory Visit: Payer: Self-pay | Admitting: Family Medicine

## 2016-12-27 DIAGNOSIS — F331 Major depressive disorder, recurrent, moderate: Secondary | ICD-10-CM

## 2017-01-04 ENCOUNTER — Other Ambulatory Visit: Payer: Self-pay | Admitting: Family Medicine

## 2017-01-04 DIAGNOSIS — I10 Essential (primary) hypertension: Secondary | ICD-10-CM

## 2017-01-05 NOTE — Telephone Encounter (Signed)
Patient requesting ref[facility] of [facility]HCTZ [facility]  

## 2017-01-08 DIAGNOSIS — F331 Major depressive disorder, recurrent, moderate: Secondary | ICD-10-CM | POA: Diagnosis not present

## 2017-01-08 DIAGNOSIS — Z600 Problems of adjustment to life-cycle transitions: Secondary | ICD-10-CM | POA: Diagnosis not present

## 2017-01-08 DIAGNOSIS — F411 Generalized anxiety disorder: Secondary | ICD-10-CM | POA: Diagnosis not present

## 2017-01-13 ENCOUNTER — Other Ambulatory Visit: Payer: Self-pay | Admitting: Gastroenterology

## 2017-01-13 NOTE — Telephone Encounter (Signed)
*  STAT* If patient is at the pharmacy, call can be transferred to refill team.   1. Which medications need to be refilled? (please list name of each medication and dose if known) Dicyclomine 20 mg    2. Which pharmacy/location (including street and city if local pharmacy) is medication to be sent to? [facility] [address]   3. Do they need a 30 day or 90 day supply?  90 day

## 2017-01-16 ENCOUNTER — Other Ambulatory Visit: Payer: Self-pay

## 2017-01-16 MED ORDER — DICYCLOMINE HCL 20 MG PO TABS: 20.0000 mg | ORAL_TABLET | Freq: Three times a day (TID) | ORAL | 0 refills | Status: DC

## 2017-01-16 NOTE — Telephone Encounter (Signed)
Sent refill to pt's pharmacy. Requested pt to schedule a follow up appt.

## 2017-01-21 ENCOUNTER — Encounter: Payer: Self-pay | Admitting: Family Medicine

## 2017-01-22 ENCOUNTER — Other Ambulatory Visit: Payer: Self-pay

## 2017-01-22 ENCOUNTER — Other Ambulatory Visit: Payer: Self-pay | Admitting: Family Medicine

## 2017-01-22 DIAGNOSIS — IMO0002 Reserved for concepts with insufficient information to code with codable children: Secondary | ICD-10-CM

## 2017-01-22 DIAGNOSIS — N183 Chronic kidney disease, stage 3 (moderate): Principal | ICD-10-CM

## 2017-01-22 DIAGNOSIS — E1122 Type 2 diabetes mellitus with diabetic chronic kidney disease: Secondary | ICD-10-CM

## 2017-01-22 DIAGNOSIS — E1165 Type 2 diabetes mellitus with hyperglycemia: Principal | ICD-10-CM

## 2017-01-22 MED ORDER — DULAGLUTIDE 1.5 MG/0.5ML ~~LOC~~ SOAJ: 1.5000 mg | SUBCUTANEOUS | 1 refills | Status: DC

## 2017-01-23 ENCOUNTER — Other Ambulatory Visit: Payer: Self-pay | Admitting: Family Medicine

## 2017-01-23 DIAGNOSIS — E1122 Type 2 diabetes mellitus with diabetic chronic kidney disease: Secondary | ICD-10-CM

## 2017-01-23 DIAGNOSIS — N183 Chronic kidney disease, stage 3 (moderate): Principal | ICD-10-CM

## 2017-01-23 DIAGNOSIS — IMO0002 Reserved for concepts with insufficient information to code with codable children: Secondary | ICD-10-CM

## 2017-01-23 DIAGNOSIS — E1165 Type 2 diabetes mellitus with hyperglycemia: Principal | ICD-10-CM

## 2017-01-23 MED ORDER — DULAGLUTIDE 1.5 MG/0.5ML ~~LOC~~ SOAJ: 1.5000 mg | SUBCUTANEOUS | 1 refills | Status: DC

## 2017-01-23 NOTE — Telephone Encounter (Signed)
Patient would like me to call and cancel the entire Trulicity prescription at [place] and please only send Trulicity to [place].

## 2017-01-23 NOTE — Telephone Encounter (Signed)
A prescription for trulicity injector was sent to the patient mail order pharmacy. However, through [employer] patient had requested for [facility] to [facility] due to her being completely out. Patient also states that [facility] is trying to reach Dr Ancil Boozer to discuss the trulicity prescription, however they have not had any luck

## 2017-01-25 ENCOUNTER — Other Ambulatory Visit: Payer: Self-pay | Admitting: Family Medicine

## 2017-01-25 DIAGNOSIS — E785 Hyperlipidemia, unspecified: Secondary | ICD-10-CM

## 2017-01-29 DIAGNOSIS — F411 Generalized anxiety disorder: Secondary | ICD-10-CM | POA: Diagnosis not present

## 2017-01-29 DIAGNOSIS — Z600 Problems of adjustment to life-cycle transitions: Secondary | ICD-10-CM | POA: Diagnosis not present

## 2017-01-29 DIAGNOSIS — F331 Major depressive disorder, recurrent, moderate: Secondary | ICD-10-CM | POA: Diagnosis not present

## 2017-01-30 ENCOUNTER — Other Ambulatory Visit: Payer: Self-pay | Admitting: Family Medicine

## 2017-01-30 DIAGNOSIS — G4700 Insomnia, unspecified: Secondary | ICD-10-CM

## 2017-02-12 ENCOUNTER — Other Ambulatory Visit: Payer: Self-pay

## 2017-02-12 DIAGNOSIS — F331 Major depressive disorder, recurrent, moderate: Secondary | ICD-10-CM | POA: Diagnosis not present

## 2017-02-12 DIAGNOSIS — Z600 Problems of adjustment to life-cycle transitions: Secondary | ICD-10-CM | POA: Diagnosis not present

## 2017-02-12 DIAGNOSIS — F411 Generalized anxiety disorder: Secondary | ICD-10-CM | POA: Diagnosis not present

## 2017-02-12 MED ORDER — DICYCLOMINE HCL 20 MG PO TABS: 20.0000 mg | ORAL_TABLET | Freq: Three times a day (TID) | ORAL | 0 refills | Status: DC

## 2017-02-16 IMAGING — US ABDOMEN ULTRASOUND LIMITED
1 series · 14 of 25 positions shown · non-contrast
Comparison: 11/22/2012.

CLINICAL DATA: 64-year-old female with right upper quadrant pain
extending to back for the past 2 years. Initial encounter.

EXAM:
US ABDOMEN LIMITED - RIGHT UPPER QUADRANT

[Series 1: abdomen ultrasound limited · 0.20mm/px · 14 of 91 slices shown]
[im 1/91]
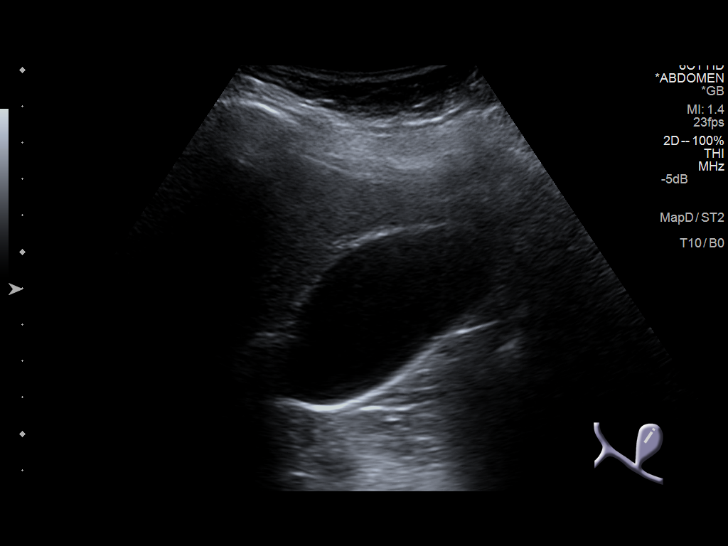
[im 8/91]
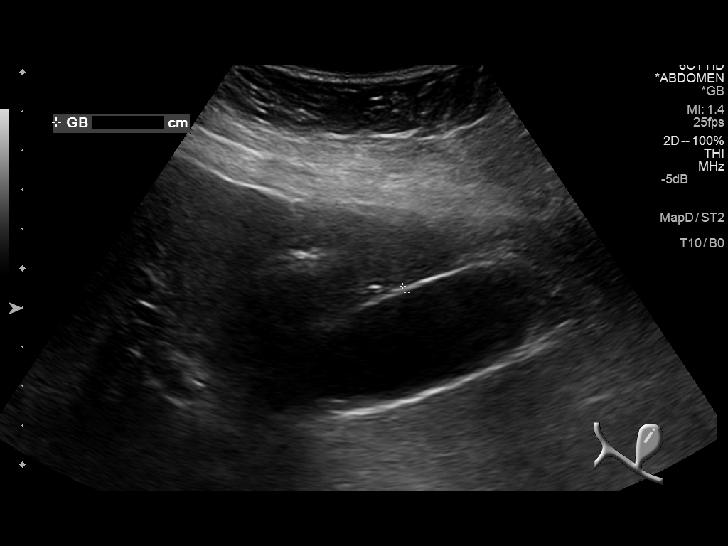
[im 16/91]
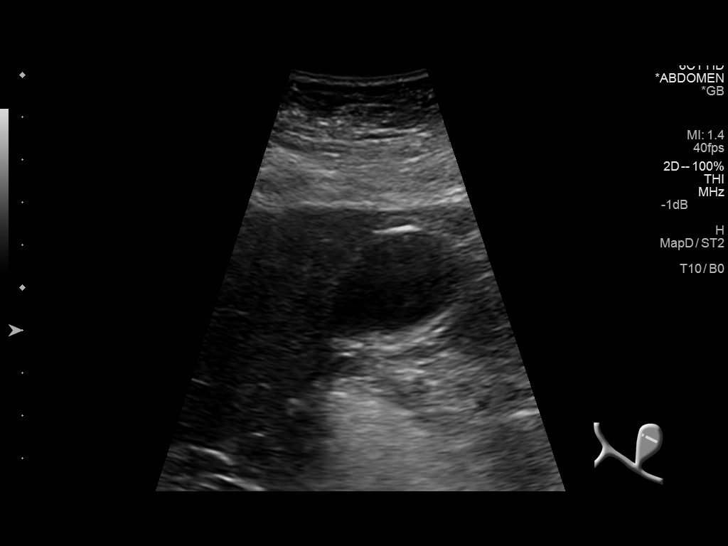
[im 23/91]
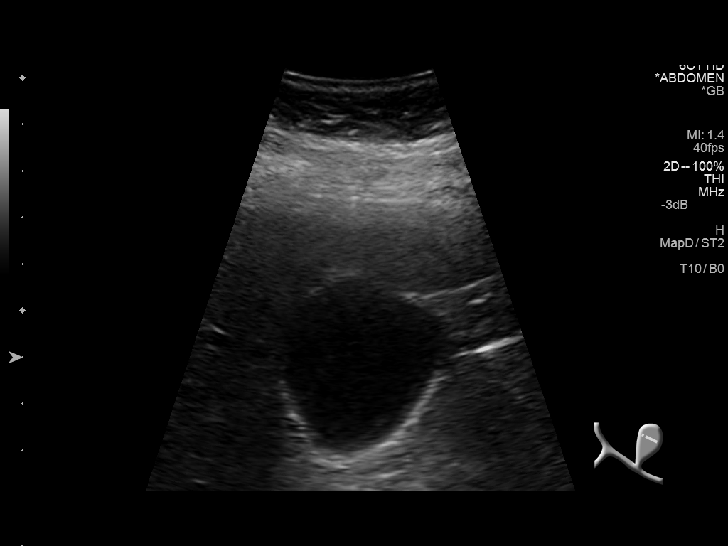
[im 31/91]
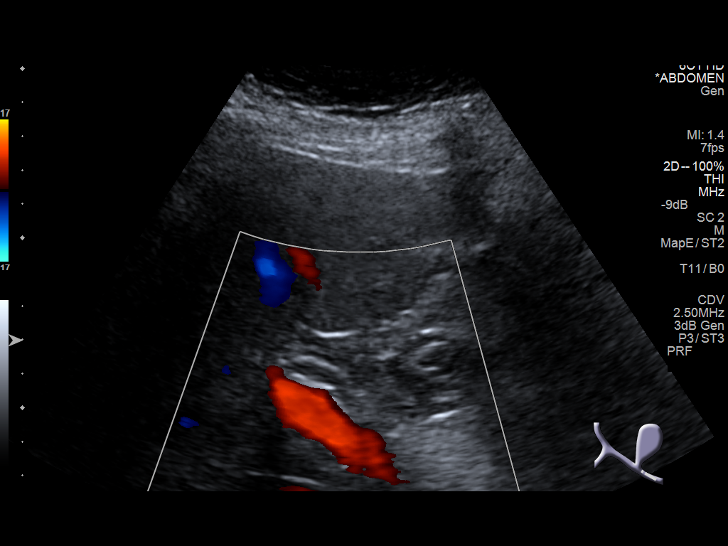
[im 34/91]
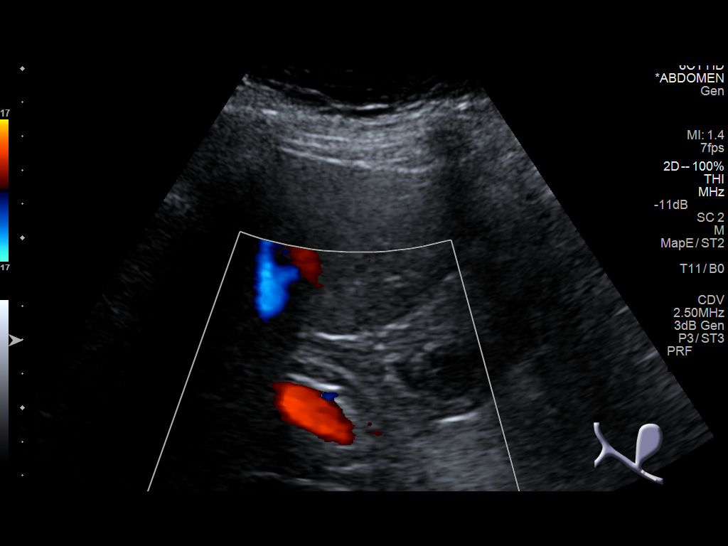
[im 42/91]
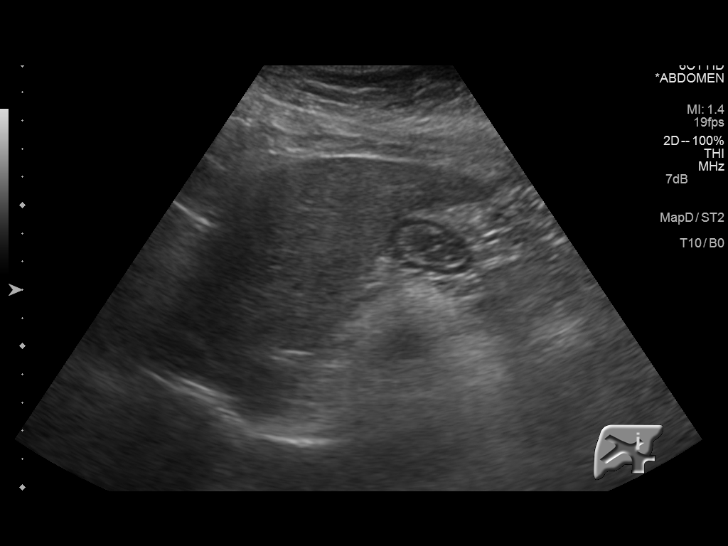
[im 49/91]
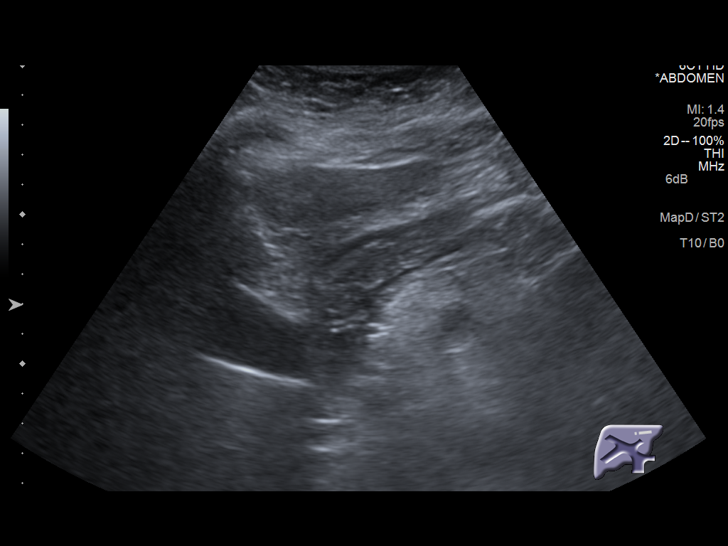
[im 57/91]
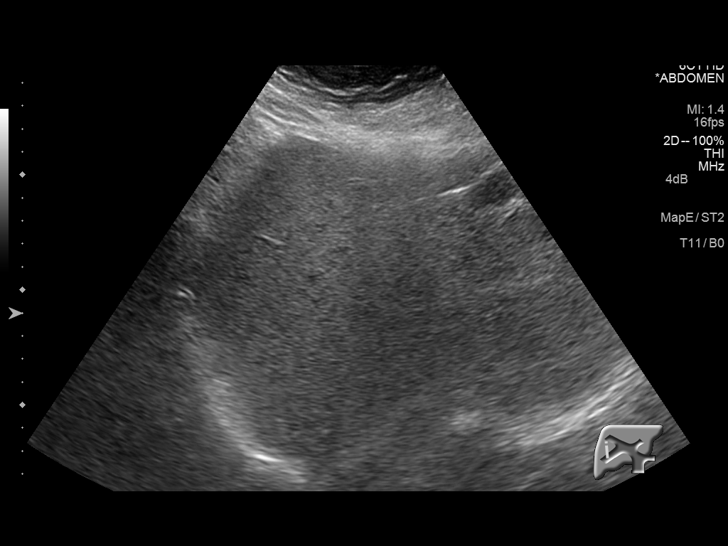
[im 61/91]
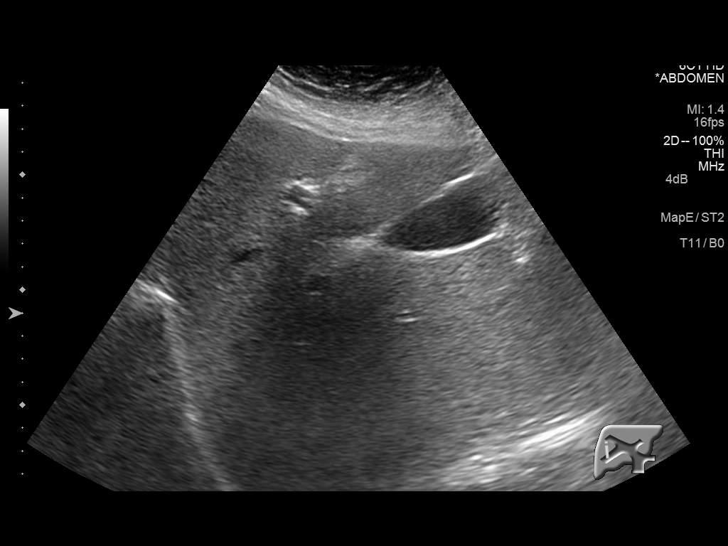
[im 68/91]
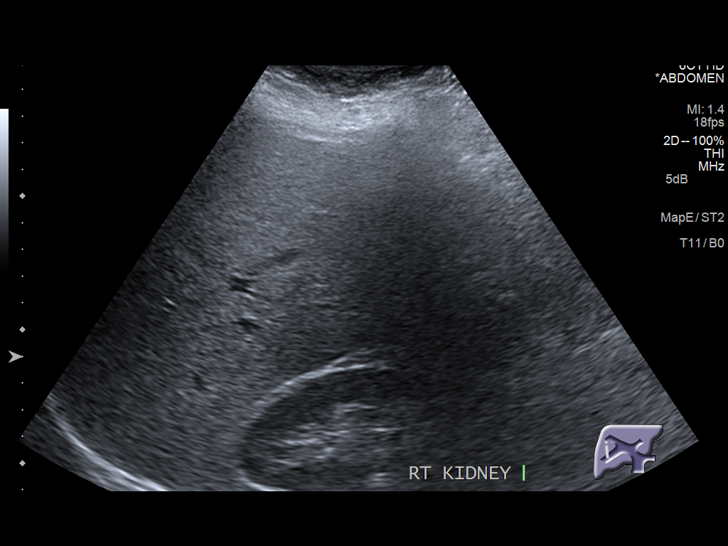
[im 76/91]
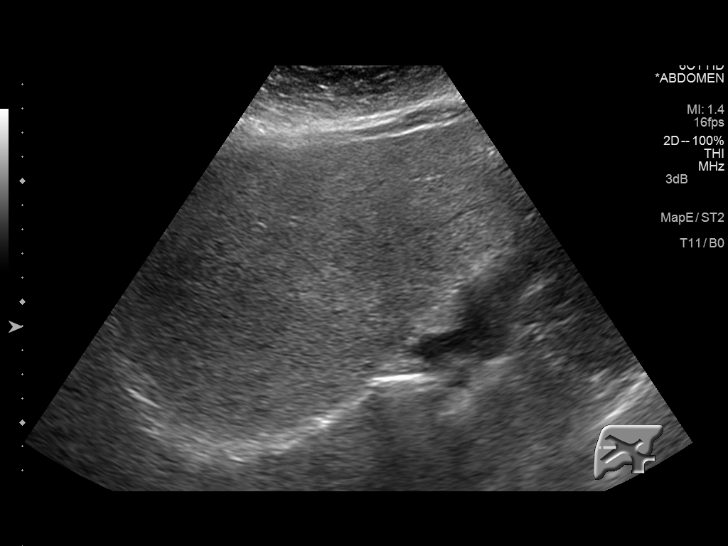
[im 83/91]
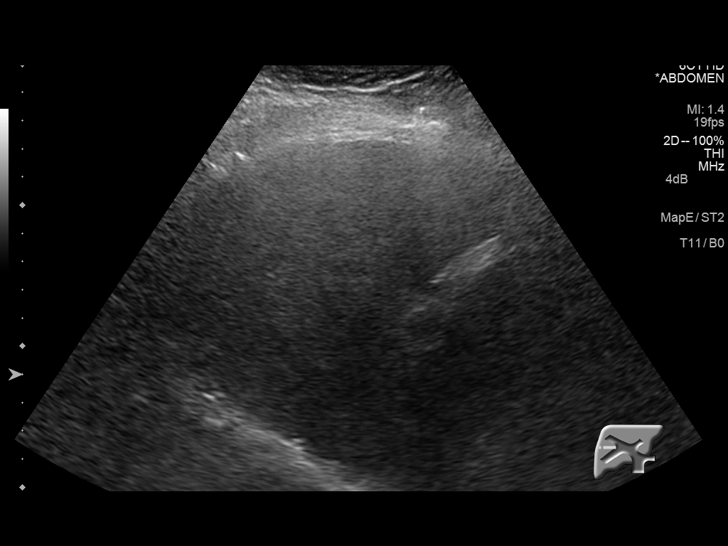
[im 91/91]
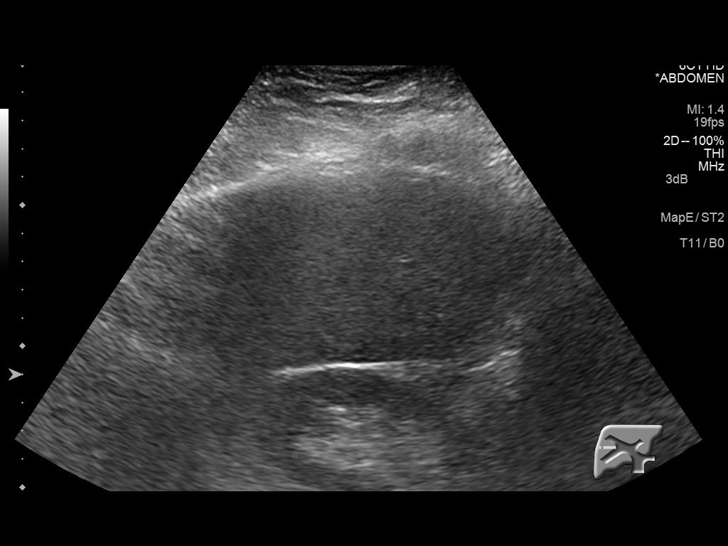

[14 of 25 positions shown; findings below may reference images not displayed]

FINDINGS: Gallbladder:

No gallstones or wall thickening visualized. No sonographic Murphy
sign noted.

Common bile duct:

Diameter: 4.7 mm.

Liver:

Diffuse increased echogenicity consistent with fatty infiltration
without focal mass identified
IMPRESSION: No gallstones or sonographic evidence of gallbladder inflammation.

Diffuse fatty infiltration the liver.

## 2017-02-17 DIAGNOSIS — M1990 Unspecified osteoarthritis, unspecified site: Secondary | ICD-10-CM | POA: Diagnosis not present

## 2017-02-17 DIAGNOSIS — L4050 Arthropathic psoriasis, unspecified: Secondary | ICD-10-CM | POA: Diagnosis not present

## 2017-02-17 DIAGNOSIS — L409 Psoriasis, unspecified: Secondary | ICD-10-CM | POA: Diagnosis not present

## 2017-02-17 DIAGNOSIS — M353 Polymyalgia rheumatica: Secondary | ICD-10-CM | POA: Diagnosis not present

## 2017-02-18 ENCOUNTER — Encounter: Payer: Self-pay | Admitting: Family Medicine

## 2017-02-23 ENCOUNTER — Other Ambulatory Visit: Payer: Self-pay

## 2017-02-23 DIAGNOSIS — G4700 Insomnia, unspecified: Secondary | ICD-10-CM

## 2017-02-23 NOTE — Telephone Encounter (Signed)
Patient requesting [facility] [address] with a 90 day supply.

## 2017-02-24 ENCOUNTER — Ambulatory Visit (INDEPENDENT_AMBULATORY_CARE_PROVIDER_SITE_OTHER): Payer: Medicare Other

## 2017-02-24 DIAGNOSIS — Z23 Encounter for immunization: Secondary | ICD-10-CM

## 2017-02-24 MED ORDER — QUETIAPINE FUMARATE 25 MG PO TABS: 25.0000 mg | ORAL_TABLET | Freq: Every day | ORAL | 1 refills | Status: DC

## 2017-03-02 IMAGING — MR MRI THORACIC SPINE WITHOUT CONTRAST
6 series · 48 of 48 positions shown · non-contrast
Comparison: CT scan of the chest dated 04/25/2008 and reports of
PET scan dated 01/20/2008 and CT scan of 01/03/2008

CLINICAL DATA: Chronic right-sided upper back pain and lateral
right chest pain.

EXAM:
MRI THORACIC SPINE WITHOUT CONTRAST
TECHNIQUE: Multiplanar, multisequence MR imaging of the thoracic spine was
performed. No intravenous contrast was administered.

[Series 4: T2 · sagittal · 4.0mm · 1.06mm/px · 5 of 13 slices shown (1 of 2)]
[im 1/13]
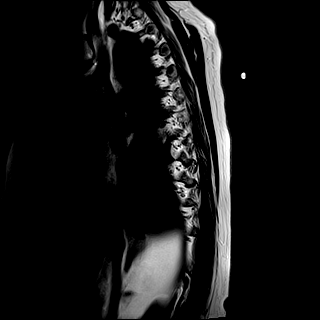
[im 4/13]
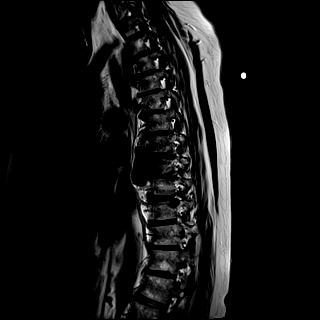
[im 7/13]
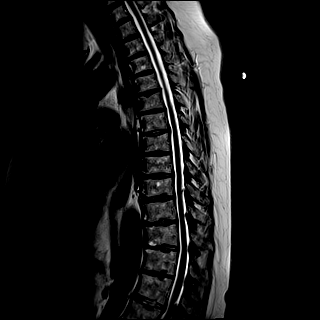
[im 10/13]
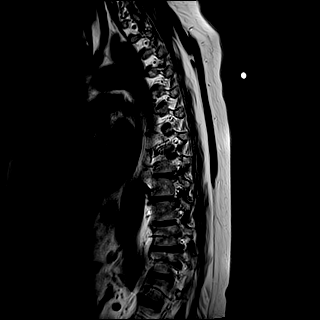
[im 13/13]
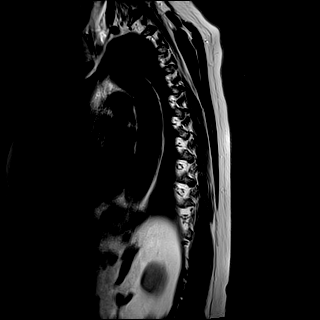

[Series 5: T1 · sagittal · 4.0mm · 1.06mm/px · 5 of 13 slices shown]
[im 1/13]
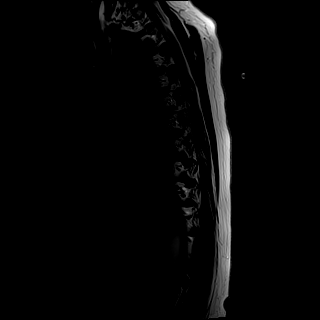
[im 4/13]
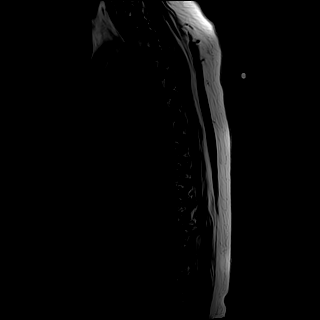
[im 7/13]
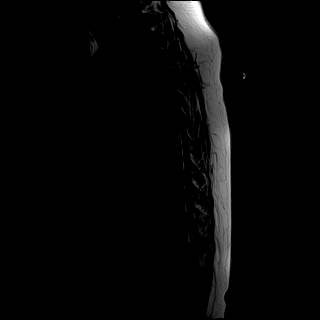
[im 10/13]
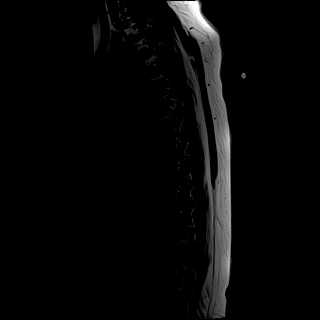
[im 13/13]
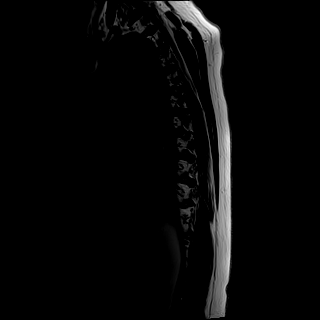

[Series 6: STIR · sagittal · 4.0mm · 1.33mm/px · 5 of 13 slices shown]
[im 1/13]
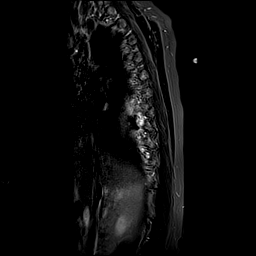
[im 4/13]
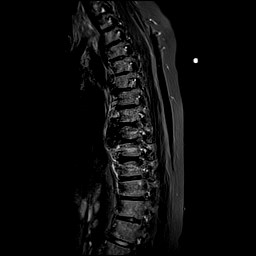
[im 7/13]
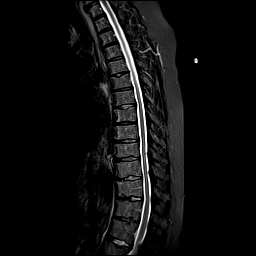
[im 10/13]
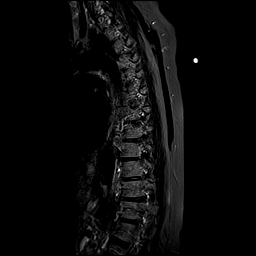
[im 13/13]
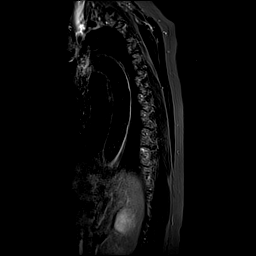

[Series 7: T2 · axial · 5.0mm · 0.86mm/px · z∈[-335,-119]mm · 15 of 39 slices shown (2 of 2)]
[im 1/39]
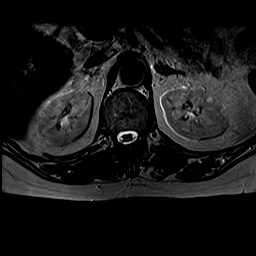
[im 3/39]
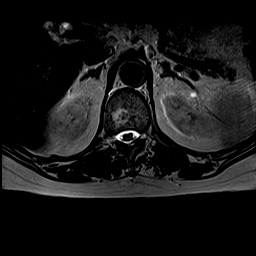
[im 6/39]
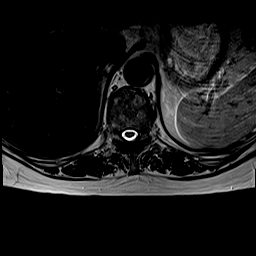
[im 9/39]
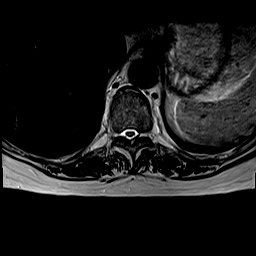
[im 11/39]
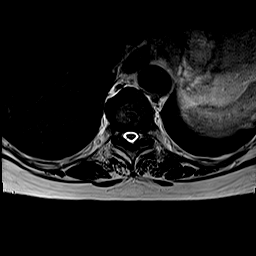
[im 14/39]
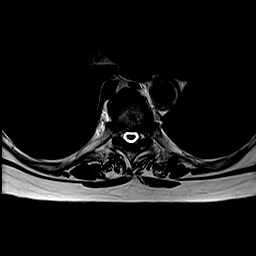
[im 17/39]
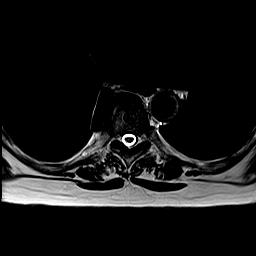
[im 20/39]
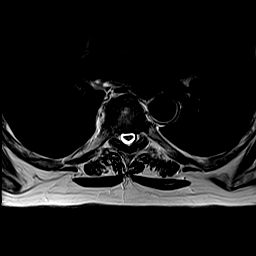
[im 22/39]
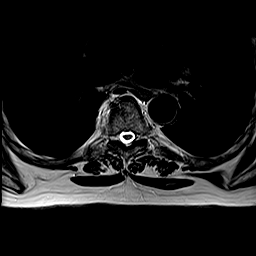
[im 25/39]
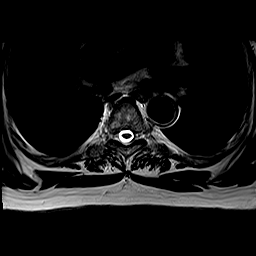
[im 28/39]
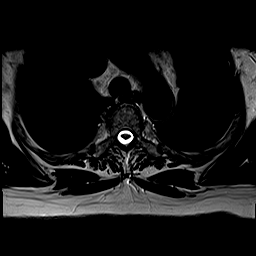
[im 30/39]
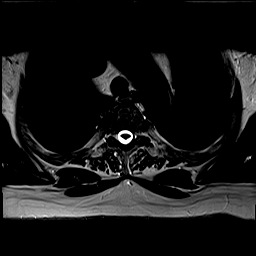
[im 33/39]
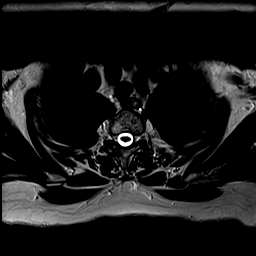
[im 36/39]
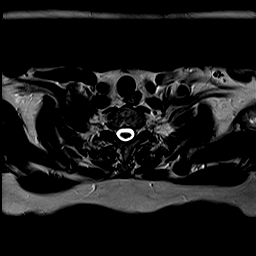
[im 39/39]
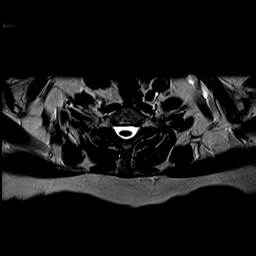

[Series 8: mpgr ax (3 · axial · 5.0mm · 0.78mm/px · z∈[-338,-114]mm · 15 of 39 slices shown]
[im 1/39]
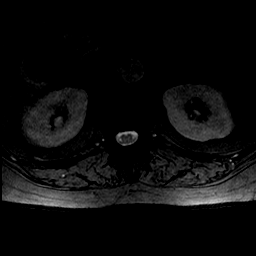
[im 3/39]
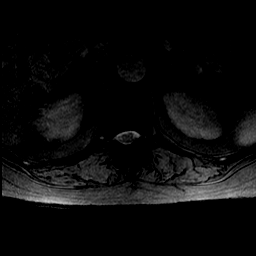
[im 6/39]
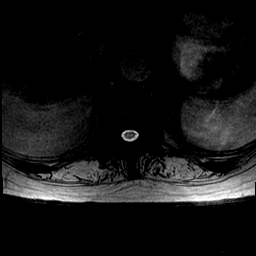
[im 9/39]
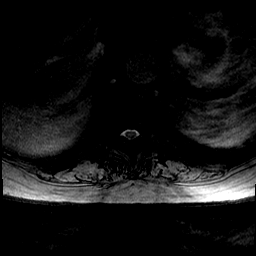
[im 11/39]
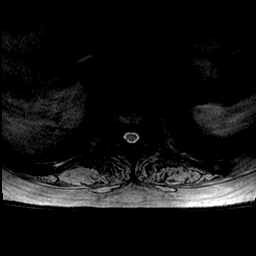
[im 14/39]
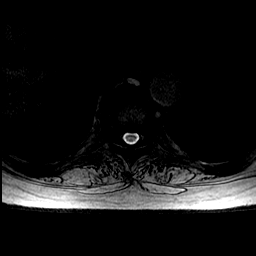
[im 17/39]
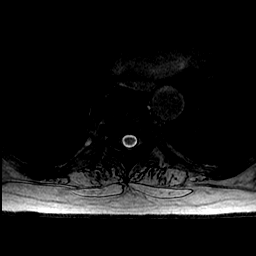
[im 20/39]
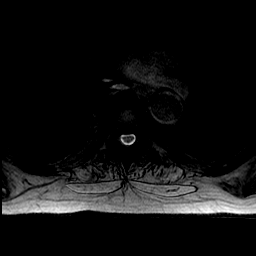
[im 22/39]
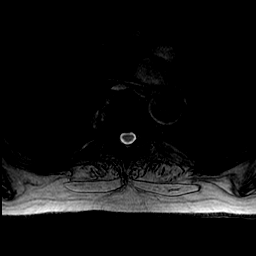
[im 25/39]
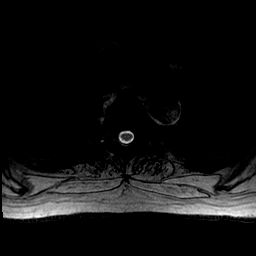
[im 28/39]
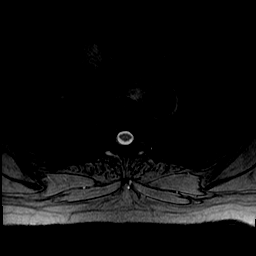
[im 30/39]
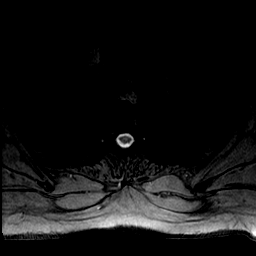
[im 33/39]
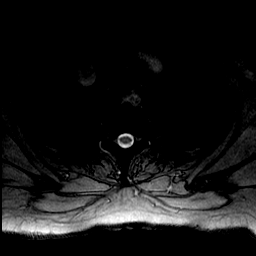
[im 36/39]
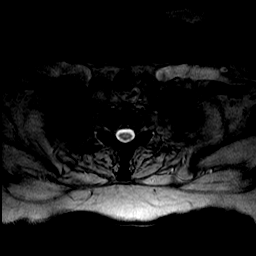
[im 39/39]
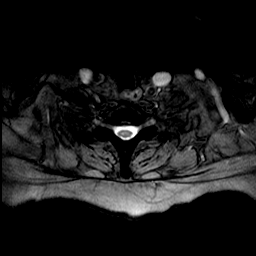

[Series 100: sag counting loc · sagittal · 5.0mm · 0.68mm/px · 3 of 7 slices shown]
[im 1/7]
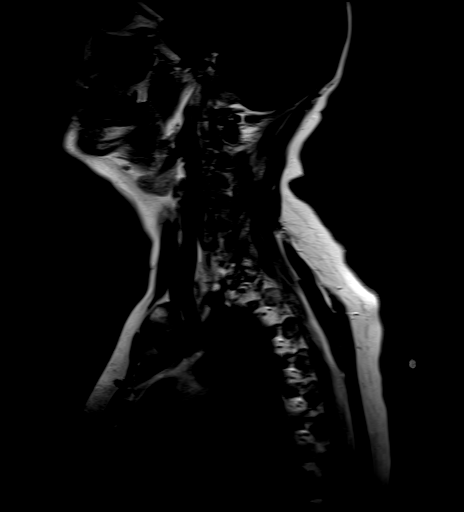
[im 4/7]
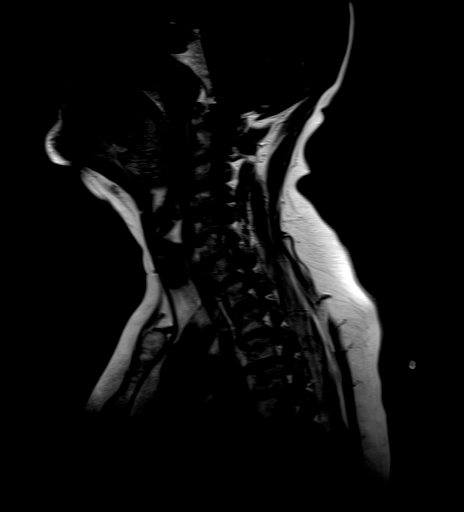
[im 7/7]
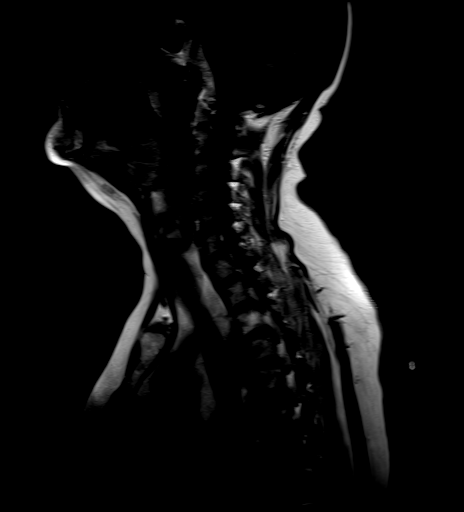

[48 of 48 positions shown; findings below may reference images not displayed]

FINDINGS: Again noted is abnormal soft tissue thickening to the right of the
thoracic spine at the T7 and T8 levels. This is at the same on
position as the soft tissue thickening noted on the prior study of
04/25/2008. Is essentially unchanged in size and configuration. Non
MRI scan is intermediate signal intensity on T1 weighted imaging and
very low signal intensity on T2 and IR imaging. There is adjacent
underlying osteophyte formation on the vertebral endplates to the
right of midline at T7-8.

The parietal pleura is chronically stretched over the mass. The mass
does not extend posteriorly toward the neural foramina or toward the
inner costal nerves. The costovertebral joints at T7-8 are normal.

The thoracic spinal cord is normal. There is no spinal stenosis or
facet arthritis. Tiny central disc bulge at T8-9 with no neural
impingement. Slight reversal of the lower thoracic kyphosis. No
pleural effusions.
IMPRESSION: Chronic stable soft tissue abnormality in the right paraspinal soft
tissues at T7-8. The low signal intensity on IR and T2 weighted
imaging suggests a chronic benign stable fibrous lesion, unchanged
since 3553. No other significant abnormalities.

## 2017-03-03 DIAGNOSIS — Z600 Problems of adjustment to life-cycle transitions: Secondary | ICD-10-CM | POA: Diagnosis not present

## 2017-03-03 DIAGNOSIS — F411 Generalized anxiety disorder: Secondary | ICD-10-CM | POA: Diagnosis not present

## 2017-03-03 DIAGNOSIS — F331 Major depressive disorder, recurrent, moderate: Secondary | ICD-10-CM | POA: Diagnosis not present

## 2017-03-05 ENCOUNTER — Other Ambulatory Visit: Payer: Self-pay | Admitting: Family Medicine

## 2017-03-05 DIAGNOSIS — E1122 Type 2 diabetes mellitus with diabetic chronic kidney disease: Secondary | ICD-10-CM

## 2017-03-05 DIAGNOSIS — E785 Hyperlipidemia, unspecified: Secondary | ICD-10-CM | POA: Diagnosis not present

## 2017-03-05 DIAGNOSIS — E049 Nontoxic goiter, unspecified: Secondary | ICD-10-CM | POA: Diagnosis not present

## 2017-03-05 DIAGNOSIS — I10 Essential (primary) hypertension: Secondary | ICD-10-CM | POA: Diagnosis not present

## 2017-03-05 DIAGNOSIS — E890 Postprocedural hypothyroidism: Secondary | ICD-10-CM | POA: Diagnosis not present

## 2017-03-05 DIAGNOSIS — N183 Chronic kidney disease, stage 3 (moderate): Principal | ICD-10-CM

## 2017-03-05 DIAGNOSIS — K229 Disease of esophagus, unspecified: Secondary | ICD-10-CM | POA: Diagnosis not present

## 2017-03-05 DIAGNOSIS — E119 Type 2 diabetes mellitus without complications: Secondary | ICD-10-CM | POA: Diagnosis not present

## 2017-03-05 DIAGNOSIS — F5101 Primary insomnia: Secondary | ICD-10-CM | POA: Diagnosis not present

## 2017-03-05 NOTE — Telephone Encounter (Signed)
[facility] 

## 2017-03-06 ENCOUNTER — Other Ambulatory Visit: Payer: Self-pay | Admitting: Family Medicine

## 2017-03-06 DIAGNOSIS — N183 Chronic kidney disease, stage 3 unspecified: Secondary | ICD-10-CM

## 2017-03-06 DIAGNOSIS — E1122 Type 2 diabetes mellitus with diabetic chronic kidney disease: Secondary | ICD-10-CM

## 2017-03-09 ENCOUNTER — Other Ambulatory Visit: Payer: Self-pay | Admitting: Family Medicine

## 2017-03-09 ENCOUNTER — Ambulatory Visit (INDEPENDENT_AMBULATORY_CARE_PROVIDER_SITE_OTHER): Payer: Medicare Other | Admitting: Family Medicine

## 2017-03-09 ENCOUNTER — Encounter: Payer: Self-pay | Admitting: Family Medicine

## 2017-03-09 VITALS — BP 140/70 | HR 82 | Resp 12 | Ht 66.0 in | Wt 191.2 lb

## 2017-03-09 DIAGNOSIS — N183 Chronic kidney disease, stage 3 unspecified: Secondary | ICD-10-CM

## 2017-03-09 DIAGNOSIS — E1122 Type 2 diabetes mellitus with diabetic chronic kidney disease: Secondary | ICD-10-CM

## 2017-03-09 DIAGNOSIS — R0982 Postnasal drip: Secondary | ICD-10-CM | POA: Diagnosis not present

## 2017-03-09 DIAGNOSIS — Z7952 Long term (current) use of systemic steroids: Secondary | ICD-10-CM

## 2017-03-09 DIAGNOSIS — F331 Major depressive disorder, recurrent, moderate: Secondary | ICD-10-CM

## 2017-03-09 DIAGNOSIS — E538 Deficiency of other specified B group vitamins: Secondary | ICD-10-CM

## 2017-03-09 DIAGNOSIS — G4700 Insomnia, unspecified: Secondary | ICD-10-CM | POA: Diagnosis not present

## 2017-03-09 DIAGNOSIS — I10 Essential (primary) hypertension: Secondary | ICD-10-CM

## 2017-03-09 DIAGNOSIS — Z79899 Other long term (current) drug therapy: Secondary | ICD-10-CM | POA: Diagnosis not present

## 2017-03-09 LAB — CBC WITH DIFFERENTIAL/PLATELET
Basophils Absolute: 62 cells/uL (ref 0–200)
Basophils Relative: 0.8 %
EOS ABS: 265 {cells}/uL (ref 15–500)
Eosinophils Relative: 3.4 %
HCT: 41.7 % (ref 35.0–45.0)
Hemoglobin: 14 g/dL (ref 11.7–15.5)
Lymphs Abs: 1295 cells/uL (ref 850–3900)
MCH: 31 pg (ref 27.0–33.0)
MCHC: 33.6 g/dL (ref 32.0–36.0)
MCV: 92.5 fL (ref 80.0–100.0)
MPV: 10.1 fL (ref 7.5–12.5)
Monocytes Relative: 8.3 %
NEUTROS PCT: 70.9 %
Neutro Abs: 5530 cells/uL (ref 1500–7800)
PLATELETS: 313 10*3/uL (ref 140–400)
RBC: 4.51 10*6/uL (ref 3.80–5.10)
RDW: 13.8 % (ref 11.0–15.0)
TOTAL LYMPHOCYTE: 16.6 %
WBC: 7.8 10*3/uL (ref 3.8–10.8)
WBCMIX: 647 {cells}/uL (ref 200–950)

## 2017-03-09 LAB — POCT GLYCOSYLATED HEMOGLOBIN (HGB A1C): Hemoglobin A1C: 6.7

## 2017-03-09 LAB — COMPLETE METABOLIC PANEL WITH GFR
AG RATIO: 1.3 (calc) (ref 1.0–2.5)
ALKALINE PHOSPHATASE (APISO): 78 U/L (ref 33–130)
ALT: 16 U/L (ref 6–29)
AST: 18 U/L (ref 10–35)
Albumin: 4 g/dL (ref 3.6–5.1)
BILIRUBIN TOTAL: 0.4 mg/dL (ref 0.2–1.2)
BUN: 19 mg/dL (ref 7–25)
CHLORIDE: 104 mmol/L (ref 98–110)
CO2: 25 mmol/L (ref 20–32)
Calcium: 9.5 mg/dL (ref 8.6–10.4)
Creat: 0.94 mg/dL (ref 0.50–0.99)
GFR, EST AFRICAN AMERICAN: 73 mL/min/{1.73_m2} (ref >=60)
GFR, Est Non African American: 63 mL/min/{1.73_m2} (ref >=60)
Globulin: 3 g/dL (calc) (ref 1.9–3.7)
Glucose, Bld: 149 mg/dL — ABNORMAL HIGH (ref 65–139)
POTASSIUM: 3.9 mmol/L (ref 3.5–5.3)
Sodium: 139 mmol/L (ref 135–146)
Total Protein: 7 g/dL (ref 6.1–8.1)

## 2017-03-09 MED ORDER — DESVENLAFAXINE SUCCINATE ER 50 MG PO TB24: 50.0000 mg | ORAL_TABLET | ORAL | 1 refills | Status: DC

## 2017-03-09 MED ORDER — CYANOCOBALAMIN 1000 MCG/ML IJ SOLN
1000.0000 ug | INTRAMUSCULAR | Status: AC
  Administered 2017-03-09: 1000 ug via INTRAMUSCULAR

## 2017-03-09 MED ORDER — FLUTICASONE PROPIONATE 50 MCG/ACT NA SUSP: 2.0000 | Freq: Every day | NASAL | 1 refills | Status: DC

## 2017-03-09 MED ORDER — ZOLPIDEM TARTRATE ER 12.5 MG PO TBCR: 12.5000 mg | EXTENDED_RELEASE_TABLET | Freq: Every evening | ORAL | 0 refills | Status: DC | PRN

## 2017-03-09 MED ORDER — DILTIAZEM HCL ER 180 MG PO CP24: 180.0000 mg | ORAL_CAPSULE | Freq: Every day | ORAL | 1 refills | Status: DC

## 2017-03-09 MED ORDER — METFORMIN HCL ER 750 MG PO TB24: 750.0000 mg | ORAL_TABLET | Freq: Every day | ORAL | 1 refills | Status: DC

## 2017-03-09 NOTE — Progress Notes (Signed)
Name: Christine Jensen   MRN: [id number]    DOB: 1950-07-03   Date:03/09/2017       Progress Note  Subjective  Chief Complaint  Chief Complaint  Patient presents with  . Diabetes  . Insomnia  . Depression    HPI  DMII: she is now on Trulicity only, off Metformin, doing well, last hgbA1C 7.0%, today it is 6.7%, he denies polyphagia, polydipsia or polyuria. Weight is stable. CKI on ARB.   Insomnia: she has been on Ambien CR for many years, she goes to bed around 12:30, she takes Ambien around 1 am and it takes hours sometimes to fall asleep, she sleeps in the am's. She tried Seroquel and Trazodone in the past, and did not work, explained controlled medication, no early fills.   HTN: bp was elevated when she first got in, but improved with recheck labs,  no chest pain, palpitation, tolerating medication.  Chronic pain/PMR: still has pain, but pain is better controlled, pain worse on right cyst/spot on back, at this time still average 4/10 also has some other aches and pain, at times on outer hips when she walks for a prolonged period of time. She was on prednisone for over one year, she is now only on methotrexate and is tolerating it well. Still sees Rheumatologist   Depression/GAD: She is feeling better since we changed to Pristiq back in 05/2016. She has failed Lexapro and Cymbalta. She has noticed that she is more patient with her husband and less frustrated, but still has fatigue - mostly from rheumatological problems. She has anhedonia, but planning to move to [facility] and able to focus and plan for it. Denies suicidal thoughts and ideation.  B12 deficiency:she takes Metformin and does not eat meat. We will continue monthly injections, not due for injection today.   Post-nasal drainage: she states that over the past couple of weeks, also has nasal congestion that is worse at night, no pruritis, cough or SOB   Patient Active Problem List   Diagnosis Date Noted  . Class 1 obesity due to  excess calories with serious comorbidity and body mass index (BMI) of 32.0 to 32.9 in adult 06/30/2016  . Action tremor 06/30/2016  . B12 deficiency 05/28/2016  . Perennial allergic rhinitis with seasonal variation 01/15/2016  . Senile purpura ([place]) 01/15/2016  . Polymyalgia rheumatica ([place]) 11/13/2015  . Controlled type 2 diabetes mellitus with stage 3 chronic kidney disease, without long-term current use of insulin ([place]) 11/13/2015  . Osteoarthritis 08/01/2015  . Psoriatic arthritis ([place]) 08/01/2015  . Absence of sense of taste 02/09/2015  . Paraspinal mass 02/09/2015  . Hypertension, benign 02/09/2015  . Postherpetic neuralgia 02/09/2015  . IBS (irritable bowel syndrome) 12/08/2014  . Insomnia, persistent 12/08/2014  . Chronic back pain 12/08/2014  . Depression, major, recurrent, moderate ([place]) 11/24/2014  . Generalized anxiety disorder 11/24/2014  . Hypercholesteremia 09/15/2014  . Hypothyroid 09/15/2014    Past Surgical History:  Procedure Laterality Date  . ABDOMINAL PERINEAL BOWEL RESECTION    . APPENDECTOMY    . HERNIA REPAIR    . THYROIDECTOMY Left    Dr. Ronnald Collum on 03/06/16    Family History  Problem Relation Age of Onset  . Arthritis Father   . Heart disease Mother   . Alzheimer's disease Mother   . Breast cancer Neg Hx     Social History   Social History  . Marital status: Married    Spouse name: N/A  . Number of children: N/A  .  Years of education: N/A   Occupational History  . Not on file.   Social History Main Topics  . Smoking status: Never Smoker  . Smokeless tobacco: Never Used  . Alcohol use 0.0 oz/week     Comment: rarely  . Drug use: No  . Sexual activity: Yes    Partners: Male    Birth control/ protection: None   Other Topics Concern  . Not on file   Social History Narrative   Moved here with husband and son from [place]         Current Outpatient Prescriptions:  .  aspirin 81 MG tablet, Take 1 tablet by mouth every other day. ,  Disp: , Rfl:  .  Clindamycin Phosphate foam, Apply topically., Disp: , Rfl:  .  clobetasol (TEMOVATE) 0.05 % external solution, , Disp: , Rfl:  .  Clobetasol Propionate 0.05 % shampoo, , Disp: , Rfl:  .  cyclobenzaprine (FLEXERIL) 10 MG tablet, TAKE 1 TABLET BY MOUTH UP TO THREE TIMES DAILY FOR MUSCLE SPASMS (Patient taking differently: TAKE 1 TABLET BY MOUTH UP TO THREE TIMES DAILY FOR MUSCLE SPASMS AS NEEDED), Disp: 90 tablet, Rfl: 0 .  desvenlafaxine (PRISTIQ) 50 MG 24 hr tablet, Take 1 tablet (50 mg total) by mouth every morning., Disp: 90 tablet, Rfl: 1 .  dicyclomine (BENTYL) 20 MG tablet, Take 1 tablet (20 mg total) by mouth 3 (three) times daily. **PLEASE SCHEDULE A FOLLOW UP APPT**, Disp: 270 tablet, Rfl: 0 .  diltiazem (DILACOR XR) 180 MG 24 hr capsule, Take 1 capsule (180 mg total) by mouth daily. For bp, Disp: 90 capsule, Rfl: 1 .  Dulaglutide (TRULICITY) 1.5 ZO/1.0RU SOPN, Inject 1.5 mg into the skin once a week., Disp: 12 pen, Rfl: 1 .  folic acid (FOLVITE) 1 MG tablet, Take 1 mg by mouth daily., Disp: , Rfl: 2 .  glucose blood test strip, Use as instructed, Disp: 100 each, Rfl: 12 .  HYDROcodone-acetaminophen (NORCO) 10-325 MG tablet, Take 1 tablet by mouth every 6 (six) hours as needed., Disp: 40 tablet, Rfl: 0 .  JUBLIA 10 % SOLN, , Disp: , Rfl:  .  levothyroxine (SYNTHROID, LEVOTHROID) 112 MCG tablet, Take 1 tablet (112 mcg total) by mouth daily before breakfast., Disp: 90 tablet, Rfl: 1 .  lidocaine (LIDODERM) 5 %, Place 1 patch onto the skin as needed. , Disp: , Rfl: 2 .  losartan-hydrochlorothiazide (HYZAAR) 50-12.5 MG tablet, TAKE 1 TABLET DAILY FOR    BLOOD PRESSURE, Disp: 90 tablet, Rfl: 1 .  metFORMIN (GLUCOPHAGE-XR) 750 MG 24 hr tablet, TAKE 1 TABLET (750 MG TOTAL) BY MOUTH DAILY WITH BREAKFAST., Disp: 90 tablet, Rfl: 1 .  methotrexate (RHEUMATREX) 2.5 MG tablet, TAKE 8 TABLETS ONCE A WEEK, Disp: , Rfl: 2 .  pravastatin (PRAVACHOL) 40 MG tablet, TAKE 1 TABLET DAILY, Disp:  90 tablet, Rfl: 1 .  pyrithione zinc (HEAD AND SHOULDERS) 1 % shampoo, Apply topically daily as needed for itching., Disp: , Rfl:  .  zolpidem (AMBIEN CR) 12.5 MG CR tablet, Take 1 tablet (12.5 mg total) by mouth at bedtime as needed for sleep., Disp: 90 tablet, Rfl: 0 .  fluticasone (FLONASE) 50 MCG/ACT nasal spray, Place 2 sprays into both nostrils daily., Disp: 48 g, Rfl: 1  Current Facility-Administered Medications:  .  cyanocobalamin ((VITAMIN B-12)) injection 1,000 mcg, 1,000 mcg, Intramuscular, Q30 days, Steele Sizer, MD  Allergies  Allergen Reactions  . Erythromycin Shortness Of Breath  . Macadamia Nut Oil Shortness Of  Breath    Oily nuts   . Codeine   . Lexapro [Escitalopram] Other (See Comments)    Lethargy and inefective   . Penicillins   . Sulfa Antibiotics Other (See Comments)    Joints become painful   . Aspartame Rash  . Other Rash    Shoes and Environmental     ROS  Constitutional: Negative for fever or weight change.  Respiratory: Negative for cough and shortness of breath.   Cardiovascular: Negative for chest pain or palpitations.  Gastrointestinal: Negative for abdominal pain, no bowel changes.  Musculoskeletal: Negative for gait problem positive left hand  joint swelling.  Skin: Negative for rash.  Neurological: Negative for dizziness or headache.  No other specific complaints in a complete review of systems (except as listed in HPI above).  Objective  Vitals:   03/09/17 1336 03/09/17 1347  BP: (!) 150/80 140/70  Pulse: 82   Resp: 12   SpO2: 98%   Weight: 191 lb 3.2 oz (86.7 kg)   Height: 5\' 6"  (1.676 m)     Body mass index is 30.86 kg/m.  Physical Exam  Constitutional: Patient appears well-developed and well-nourished. Obese  No distress.  HEENT: head atraumatic, normocephalic, pupils equal and reactive to light,  neck supple, throat within normal limits Cardiovascular: Normal rate, regular rhythm and normal heart sounds.  No murmur  heard. No BLE edema. Pulmonary/Chest: Effort normal and breath sounds normal. No respiratory distress. Abdominal: Soft.  There is no tenderness. Psychiatric: Patient has a normal mood and affect. behavior is normal. Judgment and thought content normal.  Recent Results (from the past 2160 hour(s))  POCT HgB A1C     Status: Abnormal   Collection Time: 03/09/17  2:00 PM  Result Value Ref Range   Hemoglobin A1C 6.7       PHQ2/9: Depression screen [facility] 2/9 11/24/2016 08/21/2016 04/22/2016 01/15/2016 12/26/2015  Decreased Interest 1 0 3 1 2   Down, Depressed, Hopeless 3 1 3 1 2   PHQ - 2 Score 4 1 6 2 4   Altered sleeping 3 3 3 3 2   Tired, decreased energy 0 2 0 1 1  Change in appetite 3 3 3 3 1   Feeling bad or failure about yourself  1 2 3 1 1   Trouble concentrating 0 0 0 1 0  Moving slowly or fidgety/restless 0 0 0 1 0  Suicidal thoughts 0 0 0 0 0  PHQ-9 Score 11 11 15 12 9   Difficult doing work/chores Somewhat difficult Somewhat difficult Somewhat difficult - Somewhat difficult     Fall Risk: Fall Risk  11/24/2016 04/22/2016 01/15/2016 12/26/2015 11/13/2015  Falls in the past year? No Yes No No No  Number falls in past yr: - 1 - - -  Injury with Fall? - Yes - - -  Risk for fall due to : Other (Comment) - - - -  Risk for fall due to: Comment back pain and pain medicine  - - - -     Assessment & Plan  1. Controlled type 2 diabetes mellitus with stage 3 chronic kidney disease, without long-term current use of insulin (HCC)  - POCT HgB A1C at goal 6.7%  2. B12 deficiency  - cyanocobalamin ((VITAMIN B-12)) injection 1,000 mcg; Inject 1 mL (1,000 mcg total) into the muscle every 30 (thirty) days.  3. Insomnia, persistent  She will stop Seroquel since it did not work for her, can only sleep on Ambien  - zolpidem (AMBIEN CR) 12.5 MG  CR tablet; Take 1 tablet (12.5 mg total) by mouth at bedtime as needed for sleep.  Dispense: 90 tablet; Refill: 0  4. Depression, major, recurrent, moderate  (HCC)  - desvenlafaxine (PRISTIQ) 50 MG 24 hr tablet; Take 1 tablet (50 mg total) by mouth every morning.  Dispense: 90 tablet; Refill: 1  5. Current chronic use of systemic steroids  - DG Bone Density; Future  6. Post-nasal drainage  Resume nasal spray   7. Essential hypertension  - CBC with Differential/Platelet - COMPLETE METABOLIC PANEL WITH GFR - diltiazem (DILACOR XR) 180 MG 24 hr capsule; Take 1 capsule (180 mg total) by mouth daily. For bp  Dispense: 90 capsule; Refill: 1  8. Long-term use of high-risk medication  - CBC with Differential/Platelet - COMPLETE METABOLIC PANEL WITH GFR

## 2017-03-12 DIAGNOSIS — E890 Postprocedural hypothyroidism: Secondary | ICD-10-CM | POA: Diagnosis not present

## 2017-03-12 DIAGNOSIS — E785 Hyperlipidemia, unspecified: Secondary | ICD-10-CM | POA: Diagnosis not present

## 2017-03-12 DIAGNOSIS — F5101 Primary insomnia: Secondary | ICD-10-CM | POA: Diagnosis not present

## 2017-03-12 DIAGNOSIS — K229 Disease of esophagus, unspecified: Secondary | ICD-10-CM | POA: Diagnosis not present

## 2017-03-12 DIAGNOSIS — E049 Nontoxic goiter, unspecified: Secondary | ICD-10-CM | POA: Diagnosis not present

## 2017-03-12 DIAGNOSIS — E119 Type 2 diabetes mellitus without complications: Secondary | ICD-10-CM | POA: Diagnosis not present

## 2017-03-12 DIAGNOSIS — I10 Essential (primary) hypertension: Secondary | ICD-10-CM | POA: Diagnosis not present

## 2017-03-19 DIAGNOSIS — F331 Major depressive disorder, recurrent, moderate: Secondary | ICD-10-CM | POA: Diagnosis not present

## 2017-03-19 DIAGNOSIS — Z600 Problems of adjustment to life-cycle transitions: Secondary | ICD-10-CM | POA: Diagnosis not present

## 2017-03-19 DIAGNOSIS — F411 Generalized anxiety disorder: Secondary | ICD-10-CM | POA: Diagnosis not present

## 2017-03-20 ENCOUNTER — Encounter: Payer: Self-pay | Admitting: Family Medicine

## 2017-03-23 ENCOUNTER — Encounter: Payer: Self-pay | Admitting: Family Medicine

## 2017-03-30 ENCOUNTER — Telehealth: Payer: Self-pay | Admitting: Family Medicine

## 2017-03-30 ENCOUNTER — Other Ambulatory Visit: Payer: Self-pay

## 2017-03-30 ENCOUNTER — Other Ambulatory Visit: Payer: Self-pay | Admitting: Family Medicine

## 2017-03-30 ENCOUNTER — Encounter: Payer: Self-pay | Admitting: Family Medicine

## 2017-03-30 DIAGNOSIS — E1122 Type 2 diabetes mellitus with diabetic chronic kidney disease: Secondary | ICD-10-CM

## 2017-03-30 DIAGNOSIS — F331 Major depressive disorder, recurrent, moderate: Secondary | ICD-10-CM

## 2017-03-30 DIAGNOSIS — N183 Chronic kidney disease, stage 3 (moderate): Principal | ICD-10-CM

## 2017-03-30 MED ORDER — GLUCOSE BLOOD VI STRP: ORAL_STRIP | 12 refills | Status: DC

## 2017-03-30 MED ORDER — ACCU-CHEK SOFT TOUCH LANCETS MISC: 12 refills | Status: AC

## 2017-03-30 NOTE — Addendum Note (Signed)
Addended by: Inda Coke on: 03/30/2017 02:28 PM   Modules accepted: Orders

## 2017-03-30 NOTE — Telephone Encounter (Signed)
Pt requesting One Touch test strips and lancets

## 2017-03-30 NOTE — Telephone Encounter (Signed)
Patient requesting supplies for her One Touch Meter be sent to [facility].

## 2017-04-02 DIAGNOSIS — F331 Major depressive disorder, recurrent, moderate: Secondary | ICD-10-CM | POA: Diagnosis not present

## 2017-04-02 DIAGNOSIS — F411 Generalized anxiety disorder: Secondary | ICD-10-CM | POA: Diagnosis not present

## 2017-04-02 DIAGNOSIS — Z600 Problems of adjustment to life-cycle transitions: Secondary | ICD-10-CM | POA: Diagnosis not present

## 2017-04-03 ENCOUNTER — Encounter: Payer: Self-pay | Admitting: Family Medicine

## 2017-04-07 ENCOUNTER — Ambulatory Visit (INDEPENDENT_AMBULATORY_CARE_PROVIDER_SITE_OTHER): Payer: Medicare Other | Admitting: Gastroenterology

## 2017-04-07 ENCOUNTER — Encounter: Payer: Self-pay | Admitting: Gastroenterology

## 2017-04-07 VITALS — BP 136/73 | HR 75 | Temp 98.2°F | Ht 66.0 in | Wt 192.6 lb

## 2017-04-07 DIAGNOSIS — K582 Mixed irritable bowel syndrome: Secondary | ICD-10-CM | POA: Diagnosis not present

## 2017-04-07 NOTE — Progress Notes (Signed)
Primary Care Physician: Steele Sizer, MD  Primary Gastroenterologist:  Dr. Lucilla Lame  Chief Complaint  Patient presents with  . Follow up IBS    HPI: Christine Jensen is a 66 y.o. female here for follow-up of her irritable bowel syndrome.  The patient states she has been doing well with dicyclomine but ran out of her medication and is now here for follow-up and prescription refill.  She denies any nausea vomiting fevers or chills.  The patient has been watching her diet and has lost weight because of that.  Current Outpatient Medications  Medication Sig Dispense Refill  . aspirin 81 MG tablet Take 1 tablet by mouth every other day.     . Clindamycin Phosphate foam Apply topically.    . clobetasol (TEMOVATE) 0.05 % external solution     . Clobetasol Propionate 0.05 % shampoo     . cyclobenzaprine (FLEXERIL) 10 MG tablet TAKE 1 TABLET BY MOUTH UP TO THREE TIMES DAILY FOR MUSCLE SPASMS (Patient taking differently: TAKE 1 TABLET BY MOUTH UP TO THREE TIMES DAILY FOR MUSCLE SPASMS AS NEEDED) 90 tablet 0  . desvenlafaxine (PRISTIQ) 50 MG 24 hr tablet Take 1 tablet (50 mg total) by mouth every morning. 90 tablet 1  . dicyclomine (BENTYL) 20 MG tablet Take 1 tablet (20 mg total) by mouth 3 (three) times daily. **PLEASE SCHEDULE A FOLLOW UP APPT** 270 tablet 0  . diltiazem (DILACOR XR) 180 MG 24 hr capsule Take 1 capsule (180 mg total) by mouth daily. For bp 90 capsule 1  . Dulaglutide (TRULICITY) 1.5 QQ/5.9DG SOPN Inject 1.5 mg into the skin once a week. 12 pen 1  . fluticasone (FLONASE) 50 MCG/ACT nasal spray Place 2 sprays into both nostrils daily. 48 g 1  . folic acid (FOLVITE) 1 MG tablet Take 1 mg by mouth daily.  2  . glucose blood test strip Use as instructed 100 each 12  . HYDROcodone-acetaminophen (NORCO) 10-325 MG tablet Take 1 tablet by mouth every 6 (six) hours as needed. 40 tablet 0  . JUBLIA 10 % SOLN     . Lancets (ACCU-CHEK SOFT TOUCH) lancets Use as instructed 100 each 12    . levothyroxine (SYNTHROID, LEVOTHROID) 112 MCG tablet Take 1 tablet (112 mcg total) by mouth daily before breakfast. 90 tablet 1  . lidocaine (LIDODERM) 5 % Place 1 patch onto the skin as needed.   2  . losartan-hydrochlorothiazide (HYZAAR) 50-12.5 MG tablet TAKE 1 TABLET DAILY FOR    BLOOD PRESSURE 90 tablet 1  . metFORMIN (GLUCOPHAGE-XR) 750 MG 24 hr tablet Take 1 tablet (750 mg total) by mouth daily with breakfast. 90 tablet 1  . methotrexate (RHEUMATREX) 2.5 MG tablet TAKE 8 TABLETS ONCE A WEEK  2  . pravastatin (PRAVACHOL) 40 MG tablet TAKE 1 TABLET DAILY 90 tablet 1  . pyrithione zinc (HEAD AND SHOULDERS) 1 % shampoo Apply topically daily as needed for itching.    . zolpidem (AMBIEN CR) 12.5 MG CR tablet Take 1 tablet (12.5 mg total) by mouth at bedtime as needed for sleep. 90 tablet 0   Current Facility-Administered Medications  Medication Dose Route Frequency Provider Last Rate Last Dose  . cyanocobalamin ((VITAMIN B-12)) injection 1,000 mcg  1,000 mcg Intramuscular Q30 days Steele Sizer, MD   1,000 mcg at 03/09/17 1413    Allergies as of 04/07/2017 - Review Complete 04/07/2017  Allergen Reaction Noted  . Erythromycin Shortness Of Breath 04/24/2014  . Macadamia nut oil Shortness  Of Breath 04/24/2014  . Codeine  11/20/2014  . Lexapro [escitalopram] Other (See Comments) 06/17/2016  . Penicillins  11/20/2014  . Sulfa antibiotics Other (See Comments) 04/24/2014  . Aspartame Rash 08/14/2015  . Other Rash 04/24/2014    ROS:  General: Negative for anorexia, weight loss, fever, chills, fatigue, weakness. ENT: Negative for hoarseness, difficulty swallowing , nasal congestion. CV: Negative for chest pain, angina, palpitations, dyspnea on exertion, peripheral edema.  Respiratory: Negative for dyspnea at rest, dyspnea on exertion, cough, sputum, wheezing.  GI: See history of present illness. GU:  Negative for dysuria, hematuria, urinary incontinence, urinary frequency, nocturnal  urination.  Endo: Negative for unusual weight change.    Physical Examination:   BP 136/73 (BP Location: Right Arm, Patient Position: Sitting, Cuff Size: Normal)   Pulse 75   Temp 98.2 F (36.8 C) (Oral)   Ht 5\' 6"  (1.676 m)   Wt 192 lb 9.6 oz (87.4 kg)   BMI 31.09 kg/m   General: Well-nourished, well-developed in no acute distress.  Eyes: No icterus. Conjunctivae pink. Mouth: Oropharyngeal mucosa moist and pink , no lesions erythema or exudate. Lungs: Clear to auscultation bilaterally. Non-labored. Heart: Regular rate and rhythm, no murmurs rubs or gallops.  Abdomen: Bowel sounds are normal, nontender, nondistended, no hepatosplenomegaly or masses, no abdominal bruits or hernia , no rebound or guarding.   Extremities: No lower extremity edema. No clubbing or deformities. Neuro: Alert and oriented x 3.  Grossly intact. Skin: Warm and dry, no jaundice.   Psych: Alert and cooperative, normal mood and affect.  Labs:    Imaging Studies: No results found.  Assessment and Plan:   Christine Jensen is a 66 y.o. y/o female Who has irritable bowel syndrome and needs a refill of her dicyclomine. The patient will have her dicyclomine refilled.  The patient will follow up as needed.    Lucilla Lame, MD. Marval Regal   Note: This dictation was prepared with Dragon dictation along with smaller phrase technology. Any transcriptional errors that result from this process are unintentional.

## 2017-04-08 ENCOUNTER — Other Ambulatory Visit: Payer: Self-pay

## 2017-04-09 ENCOUNTER — Encounter: Payer: Self-pay | Admitting: Family Medicine

## 2017-04-10 ENCOUNTER — Other Ambulatory Visit: Payer: Self-pay | Admitting: Family Medicine

## 2017-04-10 MED ORDER — GLUCOSE BLOOD VI STRP: ORAL_STRIP | 12 refills | Status: DC

## 2017-04-13 ENCOUNTER — Other Ambulatory Visit: Payer: Self-pay

## 2017-04-13 DIAGNOSIS — E1165 Type 2 diabetes mellitus with hyperglycemia: Principal | ICD-10-CM

## 2017-04-13 DIAGNOSIS — N183 Chronic kidney disease, stage 3 (moderate): Principal | ICD-10-CM

## 2017-04-13 DIAGNOSIS — E1122 Type 2 diabetes mellitus with diabetic chronic kidney disease: Secondary | ICD-10-CM

## 2017-04-13 DIAGNOSIS — IMO0002 Reserved for concepts with insufficient information to code with codable children: Secondary | ICD-10-CM

## 2017-04-13 MED ORDER — DULAGLUTIDE 1.5 MG/0.5ML ~~LOC~~ SOAJ: 1.5000 mg | SUBCUTANEOUS | 1 refills | Status: DC

## 2017-04-13 NOTE — Telephone Encounter (Signed)
Request for diabetes medication. [facility]  Last office visit pertaining to diabetes: 03/09/2017  Lab Results  Component Value Date   HGBA1C 6.7 03/09/2017

## 2017-04-15 ENCOUNTER — Ambulatory Visit
Admission: RE | Admit: 2017-04-15 | Discharge: 2017-04-15 | Disposition: A | Discharge status: Home / Self Care | Payer: Medicare Other | Source: Ambulatory Visit | Attending: Family Medicine | Admitting: Family Medicine

## 2017-04-15 DIAGNOSIS — E2839 Other primary ovarian failure: Secondary | ICD-10-CM | POA: Insufficient documentation

## 2017-04-15 DIAGNOSIS — Z780 Asymptomatic menopausal state: Secondary | ICD-10-CM | POA: Diagnosis not present

## 2017-04-15 DIAGNOSIS — Z7952 Long term (current) use of systemic steroids: Secondary | ICD-10-CM | POA: Diagnosis not present

## 2017-04-15 DIAGNOSIS — Z13820 Encounter for screening for osteoporosis: Secondary | ICD-10-CM | POA: Diagnosis not present

## 2017-04-17 ENCOUNTER — Encounter: Payer: Self-pay | Admitting: Family Medicine

## 2017-04-21 ENCOUNTER — Other Ambulatory Visit: Payer: Self-pay

## 2017-04-21 DIAGNOSIS — E1122 Type 2 diabetes mellitus with diabetic chronic kidney disease: Secondary | ICD-10-CM

## 2017-04-21 DIAGNOSIS — N183 Chronic kidney disease, stage 3 unspecified: Secondary | ICD-10-CM

## 2017-04-21 MED ORDER — GLUCOSE BLOOD VI STRP: ORAL_STRIP | 12 refills | Status: DC

## 2017-04-21 NOTE — Telephone Encounter (Signed)
Refill request for diabetic medication:   Blood Glucose Test Strips  Last office visit pertaining to diabetes: 10/15/20018  Lab Results  Component Value Date   HGBA1C 6.7 03/09/2017   Follow up visit: 07/10/2017

## 2017-04-23 ENCOUNTER — Other Ambulatory Visit: Payer: Self-pay | Admitting: Family Medicine

## 2017-04-23 DIAGNOSIS — F411 Generalized anxiety disorder: Secondary | ICD-10-CM | POA: Diagnosis not present

## 2017-04-23 DIAGNOSIS — Z600 Problems of adjustment to life-cycle transitions: Secondary | ICD-10-CM | POA: Diagnosis not present

## 2017-04-23 DIAGNOSIS — E1122 Type 2 diabetes mellitus with diabetic chronic kidney disease: Secondary | ICD-10-CM

## 2017-04-23 DIAGNOSIS — N183 Chronic kidney disease, stage 3 (moderate): Principal | ICD-10-CM

## 2017-04-23 DIAGNOSIS — F331 Major depressive disorder, recurrent, moderate: Secondary | ICD-10-CM | POA: Diagnosis not present

## 2017-04-23 MED ORDER — GLUCOSE BLOOD VI STRP: ORAL_STRIP | 12 refills | Status: AC

## 2017-04-30 DIAGNOSIS — Z600 Problems of adjustment to life-cycle transitions: Secondary | ICD-10-CM | POA: Diagnosis not present

## 2017-04-30 DIAGNOSIS — F411 Generalized anxiety disorder: Secondary | ICD-10-CM | POA: Diagnosis not present

## 2017-04-30 DIAGNOSIS — F331 Major depressive disorder, recurrent, moderate: Secondary | ICD-10-CM | POA: Diagnosis not present

## 2017-05-13 ENCOUNTER — Other Ambulatory Visit: Payer: Self-pay | Admitting: Family Medicine

## 2017-05-13 DIAGNOSIS — E1122 Type 2 diabetes mellitus with diabetic chronic kidney disease: Secondary | ICD-10-CM

## 2017-05-13 DIAGNOSIS — IMO0002 Reserved for concepts with insufficient information to code with codable children: Secondary | ICD-10-CM

## 2017-05-13 DIAGNOSIS — N183 Chronic kidney disease, stage 3 (moderate): Principal | ICD-10-CM

## 2017-05-13 DIAGNOSIS — Z600 Problems of adjustment to life-cycle transitions: Secondary | ICD-10-CM | POA: Diagnosis not present

## 2017-05-13 DIAGNOSIS — F331 Major depressive disorder, recurrent, moderate: Secondary | ICD-10-CM | POA: Diagnosis not present

## 2017-05-13 DIAGNOSIS — E1165 Type 2 diabetes mellitus with hyperglycemia: Principal | ICD-10-CM

## 2017-05-13 DIAGNOSIS — F411 Generalized anxiety disorder: Secondary | ICD-10-CM | POA: Diagnosis not present

## 2017-05-14 ENCOUNTER — Other Ambulatory Visit: Payer: Self-pay | Admitting: Gastroenterology

## 2017-05-14 ENCOUNTER — Other Ambulatory Visit: Payer: Self-pay

## 2017-05-14 MED ORDER — DICYCLOMINE HCL 20 MG PO TABS: 20.0000 mg | ORAL_TABLET | Freq: Three times a day (TID) | ORAL | 3 refills | Status: AC

## 2017-05-15 ENCOUNTER — Other Ambulatory Visit: Payer: Self-pay | Admitting: Gastroenterology

## 2017-05-20 ENCOUNTER — Other Ambulatory Visit: Payer: Self-pay | Admitting: Family Medicine

## 2017-05-20 DIAGNOSIS — G4700 Insomnia, unspecified: Secondary | ICD-10-CM

## 2017-05-21 ENCOUNTER — Encounter: Payer: Self-pay | Admitting: Family Medicine

## 2017-05-21 ENCOUNTER — Ambulatory Visit: Payer: Self-pay | Admitting: Family Medicine

## 2017-06-03 ENCOUNTER — Encounter: Payer: Self-pay | Admitting: Family Medicine

## 2017-06-03 ENCOUNTER — Ambulatory Visit (INDEPENDENT_AMBULATORY_CARE_PROVIDER_SITE_OTHER): Payer: Medicare Other | Admitting: Family Medicine

## 2017-06-03 VITALS — BP 164/68 | HR 88 | Resp 14 | Ht 66.0 in | Wt 186.2 lb

## 2017-06-03 DIAGNOSIS — E1169 Type 2 diabetes mellitus with other specified complication: Secondary | ICD-10-CM

## 2017-06-03 DIAGNOSIS — M353 Polymyalgia rheumatica: Secondary | ICD-10-CM | POA: Diagnosis not present

## 2017-06-03 DIAGNOSIS — G4700 Insomnia, unspecified: Secondary | ICD-10-CM | POA: Diagnosis not present

## 2017-06-03 DIAGNOSIS — D849 Immunodeficiency, unspecified: Secondary | ICD-10-CM

## 2017-06-03 DIAGNOSIS — E785 Hyperlipidemia, unspecified: Secondary | ICD-10-CM | POA: Diagnosis not present

## 2017-06-03 DIAGNOSIS — F331 Major depressive disorder, recurrent, moderate: Secondary | ICD-10-CM

## 2017-06-03 DIAGNOSIS — D899 Disorder involving the immune mechanism, unspecified: Secondary | ICD-10-CM

## 2017-06-03 DIAGNOSIS — D692 Other nonthrombocytopenic purpura: Secondary | ICD-10-CM | POA: Diagnosis not present

## 2017-06-03 DIAGNOSIS — Z79899 Other long term (current) drug therapy: Secondary | ICD-10-CM | POA: Diagnosis not present

## 2017-06-03 DIAGNOSIS — N183 Chronic kidney disease, stage 3 (moderate): Secondary | ICD-10-CM | POA: Diagnosis not present

## 2017-06-03 DIAGNOSIS — I10 Essential (primary) hypertension: Secondary | ICD-10-CM | POA: Diagnosis not present

## 2017-06-03 DIAGNOSIS — E1122 Type 2 diabetes mellitus with diabetic chronic kidney disease: Secondary | ICD-10-CM

## 2017-06-03 MED ORDER — PRAVASTATIN SODIUM 40 MG PO TABS: 40.0000 mg | ORAL_TABLET | Freq: Every day | ORAL | 1 refills | Status: DC

## 2017-06-03 MED ORDER — DILTIAZEM HCL ER 180 MG PO CP24: 180.0000 mg | ORAL_CAPSULE | Freq: Every day | ORAL | 1 refills | Status: DC

## 2017-06-03 MED ORDER — DULAGLUTIDE 1.5 MG/0.5ML ~~LOC~~ SOAJ: 1.5000 mg | SUBCUTANEOUS | 1 refills | Status: DC

## 2017-06-03 MED ORDER — ZOLPIDEM TARTRATE ER 12.5 MG PO TBCR
12.5000 mg | EXTENDED_RELEASE_TABLET | Freq: Every evening | ORAL | 0 refills | Status: AC | PRN
Start: 2017-06-03 — End: ?

## 2017-06-03 MED ORDER — LOSARTAN POTASSIUM-HCTZ 50-12.5 MG PO TABS: 1.0000 | ORAL_TABLET | Freq: Every day | ORAL | 1 refills | Status: DC

## 2017-06-03 MED ORDER — DESVENLAFAXINE SUCCINATE ER 50 MG PO TB24: 50.0000 mg | ORAL_TABLET | ORAL | 1 refills | Status: DC

## 2017-06-03 MED ORDER — METFORMIN HCL ER 750 MG PO TB24: 750.0000 mg | ORAL_TABLET | Freq: Every day | ORAL | 1 refills | Status: DC

## 2017-06-03 MED ORDER — CYCLOBENZAPRINE HCL 10 MG PO TABS: ORAL_TABLET | ORAL | 0 refills | Status: DC

## 2017-06-03 NOTE — Progress Notes (Signed)
Name: Christine Jensen   MRN: [id number]    DOB: 02-13-1951   Date:06/03/2017       Progress Note  Subjective  Chief Complaint  Chief Complaint  Patient presents with  . Extremity Laceration  . Follow-up    HPI  DMII: she is now on Trulicity only, off Metformin, doing well, last hgbA1C 7.0%, 6.7% we will recheck next visit , he denies polyphagia, polydipsia or polyuria. Weight is stable. CKI on ARB.   Insomnia: she has been on Ambien CR for many years, she goes to bed around 12:30, she takes Ambien around 1 am and it takes hours sometimes to fall asleep, she sleeps in the am's. She tried Seroquel and Trazodone in the past, and did not work, explained controlled medication, no early fills. She is stable  HTN: bp was elevated today, but usually at goal, and she is in the process of moving next week. We will not adjust medication at this time, but patient will return in one week for bp check by cma, and monitor at  Home. She denies  chest pain, palpitation  Chronic pain/PMR: still has pain, but pain is better controlled, pain worse on right cyst/spot on back, at this time still average 7/10 also has some other aches and pain, at times on outer hips when she walks for a prolonged period of time. She was on prednisone for over one year, she is now only on methotrexate and is tolerating it well, she was given prednisone 2.5 mg by Rheumatologist but does not want to take it at this time.    Depression/GAD: She is feeling better since we changed to Pristiq back in 05/2016. She has failed Lexapro and Cymbalta. She has noticed that she is more patient with her husband and less frustrated, but still has fatigue - mostly from rheumatological problems. She has been packing to move to [place], she states she is not excited about moving at this time because it is too much work, but looking forward to moving up [place], since culturally she did not adjust to the [place]. Denies suicidal thoughts and ideation.  B12  deficiency:she takes Metformin and does not eat meat. We will continue monthly injections, she got one today   Arms excoriations: she has been packing to move, leaving next week to [place], and has been injuring both upper arms, she has a large area of swelling on right dorsal wrist, but is resolving now.   Patient Active Problem List   Diagnosis Date Noted  . Immunosuppression ([place]) 06/03/2017  . Class 1 obesity due to excess calories with serious comorbidity and body mass index (BMI) of 32.0 to 32.9 in adult 06/30/2016  . Action tremor 06/30/2016  . B12 deficiency 05/28/2016  . Perennial allergic rhinitis with seasonal variation 01/15/2016  . Senile purpura ([place]) 01/15/2016  . Polymyalgia rheumatica ([place]) 11/13/2015  . Controlled type 2 diabetes mellitus with stage 3 chronic kidney disease, without long-term current use of insulin ([place]) 11/13/2015  . Osteoarthritis 08/01/2015  . Psoriatic arthritis ([place]) 08/01/2015  . Absence of sense of taste 02/09/2015  . Paraspinal mass 02/09/2015  . Hypertension, benign 02/09/2015  . Postherpetic neuralgia 02/09/2015  . IBS (irritable bowel syndrome) 12/08/2014  . Insomnia, persistent 12/08/2014  . Chronic back pain 12/08/2014  . Depression, major, recurrent, moderate ([place]) 11/24/2014  . Generalized anxiety disorder 11/24/2014  . Hypercholesteremia 09/15/2014  . Hypothyroid 09/15/2014    Past Surgical History:  Procedure Laterality Date  . ABDOMINAL PERINEAL BOWEL RESECTION    .  APPENDECTOMY    . HERNIA REPAIR    . THYROIDECTOMY Left    Dr. Ronnald Collum on 03/06/16    Family History  Problem Relation Age of Onset  . Arthritis Father   . Heart disease Mother   . Alzheimer's disease Mother   . Breast cancer Neg Hx     Social History   Socioeconomic History  . Marital status: Married    Spouse name: Not on file  . Number of children: Not on file  . Years of education: Not on file  . Highest education level: Not on file  Social Needs   . Financial resource strain: Not on file  . Food insecurity - worry: Not on file  . Food insecurity - inability: Not on file  . Transportation needs - medical: Not on file  . Transportation needs - non-medical: Not on file  Occupational History  . Not on file  Tobacco Use  . Smoking status: Never Smoker  . Smokeless tobacco: Never Used  Substance and Sexual Activity  . Alcohol use: Yes    Alcohol/week: 0.0 oz    Comment: rarely  . Drug use: No  . Sexual activity: Yes    Partners: Male    Birth control/protection: None  Other Topics Concern  . Not on file  Social History Narrative   Moved here with husband and son from [place]         Current Outpatient Medications:  .  aspirin 81 MG tablet, Take 1 tablet by mouth every other day. , Disp: , Rfl:  .  Clindamycin Phosphate foam, Apply topically., Disp: , Rfl:  .  clobetasol (TEMOVATE) 0.05 % external solution, , Disp: , Rfl:  .  Clobetasol Propionate 0.05 % shampoo, , Disp: , Rfl:  .  cyclobenzaprine (FLEXERIL) 10 MG tablet, TAKE 1 TABLET BY MOUTH UP TO THREE TIMES DAILY FOR MUSCLE SPASMS AS NEEDED, Disp: 90 tablet, Rfl: 0 .  desvenlafaxine (PRISTIQ) 50 MG 24 hr tablet, Take 1 tablet (50 mg total) by mouth every morning., Disp: 90 tablet, Rfl: 1 .  dicyclomine (BENTYL) 20 MG tablet, Take 1 tablet (20 mg total) by mouth 3 (three) times daily., Disp: 270 tablet, Rfl: 3 .  diltiazem (DILACOR XR) 180 MG 24 hr capsule, Take 1 capsule (180 mg total) by mouth daily. For bp, Disp: 90 capsule, Rfl: 1 .  Dulaglutide (TRULICITY) 1.5 UJ/8.1XB SOPN, Inject 1.5 mg into the skin once a week., Disp: 12 pen, Rfl: 1 .  folic acid (FOLVITE) 1 MG tablet, Take 1 mg by mouth daily., Disp: , Rfl: 2 .  glucose blood test strip, Check fsbs twice daily, Disp: 100 each, Rfl: 12 .  JUBLIA 10 % SOLN, , Disp: , Rfl:  .  Lancets (ACCU-CHEK SOFT TOUCH) lancets, Use as instructed, Disp: 100 each, Rfl: 12 .  levothyroxine (SYNTHROID, LEVOTHROID) 112 MCG tablet, Take  1 tablet (112 mcg total) by mouth daily before breakfast., Disp: 90 tablet, Rfl: 1 .  lidocaine (LIDODERM) 5 %, Place 1 patch onto the skin as needed. , Disp: , Rfl: 2 .  losartan-hydrochlorothiazide (HYZAAR) 50-12.5 MG tablet, Take 1 tablet by mouth daily. for blood pressure, Disp: 90 tablet, Rfl: 1 .  metFORMIN (GLUCOPHAGE-XR) 750 MG 24 hr tablet, Take 1 tablet (750 mg total) by mouth daily with breakfast., Disp: 90 tablet, Rfl: 1 .  methotrexate (RHEUMATREX) 2.5 MG tablet, TAKE 8 TABLETS ONCE A WEEK, Disp: , Rfl: 2 .  pravastatin (PRAVACHOL) 40 MG tablet, Take  1 tablet (40 mg total) by mouth daily., Disp: 90 tablet, Rfl: 1 .  predniSONE (DELTASONE) 2.5 MG tablet, Take 1 tablet by mouth daily., Disp: , Rfl: 0 .  pyrithione zinc (HEAD AND SHOULDERS) 1 % shampoo, Apply topically daily as needed for itching., Disp: , Rfl:  .  zolpidem (AMBIEN CR) 12.5 MG CR tablet, Take 1 tablet (12.5 mg total) by mouth at bedtime as needed for sleep., Disp: 90 tablet, Rfl: 0  Current Facility-Administered Medications:  .  cyanocobalamin ((VITAMIN B-12)) injection 1,000 mcg, 1,000 mcg, Intramuscular, Q30 days, Steele Sizer, MD, 1,000 mcg at 03/09/17 1413  Allergies  Allergen Reactions  . Erythromycin Shortness Of Breath  . Macadamia Nut Oil Shortness Of Breath    Oily nuts   . Codeine   . Lexapro [Escitalopram] Other (See Comments)    Lethargy and inefective   . Penicillins   . Sulfa Antibiotics Other (See Comments)    Joints become painful   . Aspartame Rash  . Other Rash    Shoes and Environmental     ROS  Constitutional: Negative for fever or weight change.  Respiratory: Negative for cough and shortness of breath.   Cardiovascular: Negative for chest pain or palpitations.  Gastrointestinal: Negative for abdominal pain, no bowel changes.  Musculoskeletal: Negative for gait problem or joint swelling.  Skin: Negative for rash.  Neurological: Negative for dizziness or headache.  No other  specific complaints in a complete review of systems (except as listed in HPI above).  Objective  Vitals:   06/03/17 1328 06/03/17 1409  BP: (!) 150/80 (!) 164/68  Pulse: 88   Resp: 14   SpO2: 95%   Weight: 186 lb 3.2 oz (84.5 kg)   Height: 5\' 6"  (1.676 m)     Body mass index is 30.05 kg/m.  Physical Exam  Constitutional: Patient appears well-developed and well-nourished. Obese  No distress.  HEENT: head atraumatic, normocephalic, pupils equal and reactive to light,  neck supple, throat within normal limits Cardiovascular: Normal rate, regular rhythm and normal heart sounds.  No murmur heard. No BLE edema. Pulmonary/Chest: Effort normal and breath sounds normal. No respiratory distress. Abdominal: Soft.  There is no tenderness. Skin: senile purpura, also area of swelling on right wrist from recent trauma, but resolving  Psychiatric: Patient has a normal mood and affect. behavior is normal. Judgment and thought content normal.  Recent Results (from the past 2160 hour(s))  POCT HgB A1C     Status: Abnormal   Collection Time: 03/09/17  2:00 PM  Result Value Ref Range   Hemoglobin A1C 6.7   CBC with Differential/Platelet     Status: None   Collection Time: 03/09/17  2:31 PM  Result Value Ref Range   WBC 7.8 3.8 - 10.8 Thousand/uL   RBC 4.51 3.80 - 5.10 Million/uL   Hemoglobin 14.0 11.7 - 15.5 g/dL   HCT 41.7 35.0 - 45.0 %   MCV 92.5 80.0 - 100.0 fL   MCH 31.0 27.0 - 33.0 pg   MCHC 33.6 32.0 - 36.0 g/dL   RDW 13.8 11.0 - 15.0 %   Platelets 313 140 - 400 Thousand/uL   MPV 10.1 7.5 - 12.5 fL   Neutro Abs 5,530 1,500 - 7,800 cells/uL   Lymphs Abs 1,295 850 - 3,900 cells/uL   WBC mixed population 647 200 - 950 cells/uL   Eosinophils Absolute 265 15 - 500 cells/uL   Basophils Absolute 62 0 - 200 cells/uL   Neutrophils Relative %  70.9 %   Total Lymphocyte 16.6 %   Monocytes Relative 8.3 %   Eosinophils Relative 3.4 %   Basophils Relative 0.8 %  COMPLETE METABOLIC PANEL WITH  GFR     Status: Abnormal   Collection Time: 03/09/17  2:31 PM  Result Value Ref Range   Glucose, Bld 149 (H) 65 - 139 mg/dL    Comment: .        Non-fasting reference interval .    BUN 19 7 - 25 mg/dL   Creat 0.94 0.50 - 0.99 mg/dL    Comment: For patients >25 years of age, the reference limit for Creatinine is approximately 13% higher for people identified as African-American. .    GFR, Est Non African American 63 > OR = 60 mL/min/1.45m2   GFR, Est African American 73 > OR = 60 mL/min/1.64m2   BUN/Creatinine Ratio NOT APPLICABLE 6 - 22 (calc)   Sodium 139 135 - 146 mmol/L   Potassium 3.9 3.5 - 5.3 mmol/L   Chloride 104 98 - 110 mmol/L   CO2 25 20 - 32 mmol/L   Calcium 9.5 8.6 - 10.4 mg/dL   Total Protein 7.0 6.1 - 8.1 g/dL   Albumin 4.0 3.6 - 5.1 g/dL   Globulin 3.0 1.9 - 3.7 g/dL (calc)   AG Ratio 1.3 1.0 - 2.5 (calc)   Total Bilirubin 0.4 0.2 - 1.2 mg/dL   Alkaline phosphatase (APISO) 78 33 - 130 U/L   AST 18 10 - 35 U/L   ALT 16 6 - 29 U/L    Diabetic Foot Exam: Diabetic Foot Exam - Simple   Simple Foot Form Diabetic Foot exam was performed with the following findings:  Yes 06/03/2017  1:57 PM  Visual Inspection No deformities, no ulcerations, no other skin breakdown bilaterally:  Yes Sensation Testing Intact to touch and monofilament testing bilaterally:  Yes Pulse Check Posterior Tibialis and Dorsalis pulse intact bilaterally:  Yes Comments      PHQ2/9: Depression screen [facility] 2/9 11/24/2016 08/21/2016 04/22/2016 01/15/2016 12/26/2015  Decreased Interest 1 0 3 1 2   Down, Depressed, Hopeless 3 1 3 1 2   PHQ - 2 Score 4 1 6 2 4   Altered sleeping 3 3 3 3 2   Tired, decreased energy 0 2 0 1 1  Change in appetite 3 3 3 3 1   Feeling bad or failure about yourself  1 2 3 1 1   Trouble concentrating 0 0 0 1 0  Moving slowly or fidgety/restless 0 0 0 1 0  Suicidal thoughts 0 0 0 0 0  PHQ-9 Score 11 11 15 12 9   Difficult doing work/chores Somewhat difficult Somewhat  difficult Somewhat difficult - Somewhat difficult     Fall Risk: Fall Risk  06/03/2017 11/24/2016 04/22/2016 01/15/2016 12/26/2015  Falls in the past year? No No Yes No No  Number falls in past yr: - - 1 - -  Injury with Fall? - - Yes - -  Risk for fall due to : - Other (Comment) - - -  Risk for fall due to: Comment - back pain and pain medicine  - - -     Functional Status Survey: Is the patient deaf or have difficulty hearing?: No Does the patient have difficulty seeing, even when wearing glasses/contacts?: No Does the patient have difficulty concentrating, remembering, or making decisions?: No Does the patient have difficulty walking or climbing stairs?: No Does the patient have difficulty dressing or bathing?: No Does the patient have difficulty  doing errands alone such as visiting a doctor's office or shopping?: No    Assessment & Plan   1. Essential hypertension  - diltiazem (DILACOR XR) 180 MG 24 hr capsule; Take 1 capsule (180 mg total) by mouth daily. For bp  Dispense: 90 capsule; Refill: 1 - losartan-hydrochlorothiazide (HYZAAR) 50-12.5 MG tablet; Take 1 tablet by mouth daily. for blood pressure  Dispense: 90 tablet; Refill: 1 - COMPLETE METABOLIC PANEL WITH GFR - CBC with Differential/Platelet  2. Depression, major, recurrent, moderate (HCC)  - desvenlafaxine (PRISTIQ) 50 MG 24 hr tablet; Take 1 tablet (50 mg total) by mouth every morning.  Dispense: 90 tablet; Refill: 1  3. Controlled type 2 diabetes mellitus with stage 3 chronic kidney disease, without long-term current use of insulin (HCC)  - Dulaglutide (TRULICITY) 1.5 IW/8.0HO SOPN; Inject 1.5 mg into the skin once a week.  Dispense: 12 pen; Refill: 1 - metFORMIN (GLUCOPHAGE-XR) 750 MG 24 hr tablet; Take 1 tablet (750 mg total) by mouth daily with breakfast.  Dispense: 90 tablet; Refill: 1 - Hemoglobin A1c  4. Hyperlipidemia  - pravastatin (PRAVACHOL) 40 MG tablet; Take 1 tablet (40 mg total) by mouth daily.   Dispense: 90 tablet; Refill: 1 - Lipid panel  5. Insomnia, persistent  - zolpidem (AMBIEN CR) 12.5 MG CR tablet; Take 1 tablet (12.5 mg total) by mouth at bedtime as needed for sleep.  Dispense: 90 tablet; Refill: 0  6. Long-term use of high-risk medication  - CBC with Differential/Platelet  7. Polymyalgia rheumatica (HCC)  Doing well, off prednisone but takes 2.5 mg prednisone prn   8. Senile purpura (HCC)  Stable  9. Immunosuppression [facility])  Sees hematologist

## 2017-06-05 DIAGNOSIS — F331 Major depressive disorder, recurrent, moderate: Secondary | ICD-10-CM | POA: Diagnosis not present

## 2017-06-05 DIAGNOSIS — F411 Generalized anxiety disorder: Secondary | ICD-10-CM | POA: Diagnosis not present

## 2017-06-05 DIAGNOSIS — Z600 Problems of adjustment to life-cycle transitions: Secondary | ICD-10-CM | POA: Diagnosis not present

## 2017-06-07 ENCOUNTER — Encounter: Payer: Self-pay | Admitting: Family Medicine

## 2017-06-08 DIAGNOSIS — Z79899 Other long term (current) drug therapy: Secondary | ICD-10-CM | POA: Diagnosis not present

## 2017-06-08 DIAGNOSIS — I10 Essential (primary) hypertension: Secondary | ICD-10-CM | POA: Diagnosis not present

## 2017-06-08 DIAGNOSIS — E1122 Type 2 diabetes mellitus with diabetic chronic kidney disease: Secondary | ICD-10-CM | POA: Diagnosis not present

## 2017-06-08 DIAGNOSIS — E1165 Type 2 diabetes mellitus with hyperglycemia: Secondary | ICD-10-CM | POA: Diagnosis not present

## 2017-06-08 DIAGNOSIS — N183 Chronic kidney disease, stage 3 (moderate): Secondary | ICD-10-CM | POA: Diagnosis not present

## 2017-06-08 DIAGNOSIS — E785 Hyperlipidemia, unspecified: Secondary | ICD-10-CM | POA: Diagnosis not present

## 2017-06-09 LAB — LIPID PANEL
CHOL/HDL RATIO: 2.7 (calc) (ref <5.0)
CHOLESTEROL: 125 mg/dL (ref <200)
HDL: 47 mg/dL — ABNORMAL LOW (ref >50)
LDL Cholesterol (Calc): 61 mg/dL (calc)
NON-HDL CHOLESTEROL (CALC): 78 mg/dL (ref <130)
Triglycerides: 88 mg/dL (ref <150)

## 2017-06-09 LAB — COMPLETE METABOLIC PANEL WITH GFR
AG RATIO: 1.4 (calc) (ref 1.0–2.5)
ALT: 12 U/L (ref 6–29)
AST: 17 U/L (ref 10–35)
Albumin: 4 g/dL (ref 3.6–5.1)
Alkaline phosphatase (APISO): 87 U/L (ref 33–130)
BUN: 20 mg/dL (ref 7–25)
CALCIUM: 9.8 mg/dL (ref 8.6–10.4)
CO2: 28 mmol/L (ref 20–32)
Chloride: 101 mmol/L (ref 98–110)
Creat: 0.89 mg/dL (ref 0.50–0.99)
GFR, EST AFRICAN AMERICAN: 78 mL/min/{1.73_m2} (ref >=60)
GFR, EST NON AFRICAN AMERICAN: 67 mL/min/{1.73_m2} (ref >=60)
GLOBULIN: 2.9 g/dL (ref 1.9–3.7)
Glucose, Bld: 133 mg/dL — ABNORMAL HIGH (ref 65–99)
POTASSIUM: 4.1 mmol/L (ref 3.5–5.3)
SODIUM: 138 mmol/L (ref 135–146)
TOTAL PROTEIN: 6.9 g/dL (ref 6.1–8.1)
Total Bilirubin: 0.4 mg/dL (ref 0.2–1.2)

## 2017-06-09 LAB — CBC WITH DIFFERENTIAL/PLATELET
BASOS ABS: 95 {cells}/uL (ref 0–200)
Basophils Relative: 1 %
EOS ABS: 219 {cells}/uL (ref 15–500)
Eosinophils Relative: 2.3 %
HCT: 41.5 % (ref 35.0–45.0)
HEMOGLOBIN: 14.3 g/dL (ref 11.7–15.5)
Lymphs Abs: 1359 cells/uL (ref 850–3900)
MCH: 30.7 pg (ref 27.0–33.0)
MCHC: 34.5 g/dL (ref 32.0–36.0)
MCV: 89.1 fL (ref 80.0–100.0)
MONOS PCT: 7.7 %
MPV: 9.7 fL (ref 7.5–12.5)
NEUTROS ABS: 7097 {cells}/uL (ref 1500–7800)
Neutrophils Relative %: 74.7 %
Platelets: 393 10*3/uL (ref 140–400)
RBC: 4.66 10*6/uL (ref 3.80–5.10)
RDW: 13.8 % (ref 11.0–15.0)
Total Lymphocyte: 14.3 %
WBC mixed population: 732 cells/uL (ref 200–950)
WBC: 9.5 10*3/uL (ref 3.8–10.8)

## 2017-06-09 LAB — HEMOGLOBIN A1C
EAG (MMOL/L): 8.7 (calc)
Hgb A1c MFr Bld: 7.1 % of total Hgb — ABNORMAL HIGH (ref <5.7)
Mean Plasma Glucose: 157 (calc)

## 2017-06-11 ENCOUNTER — Encounter: Payer: Self-pay | Admitting: Family Medicine

## 2017-06-13 ENCOUNTER — Other Ambulatory Visit: Payer: Self-pay | Admitting: Family Medicine

## 2017-06-13 DIAGNOSIS — I10 Essential (primary) hypertension: Secondary | ICD-10-CM

## 2017-06-24 ENCOUNTER — Encounter: Payer: Self-pay | Admitting: Family Medicine

## 2017-06-25 ENCOUNTER — Other Ambulatory Visit: Payer: Self-pay

## 2017-06-25 DIAGNOSIS — N183 Chronic kidney disease, stage 3 unspecified: Secondary | ICD-10-CM

## 2017-06-25 DIAGNOSIS — E785 Hyperlipidemia, unspecified: Secondary | ICD-10-CM

## 2017-06-25 DIAGNOSIS — I10 Essential (primary) hypertension: Secondary | ICD-10-CM

## 2017-06-25 DIAGNOSIS — F331 Major depressive disorder, recurrent, moderate: Secondary | ICD-10-CM

## 2017-06-25 DIAGNOSIS — E1169 Type 2 diabetes mellitus with other specified complication: Secondary | ICD-10-CM

## 2017-06-25 DIAGNOSIS — E039 Hypothyroidism, unspecified: Secondary | ICD-10-CM

## 2017-06-25 DIAGNOSIS — E1122 Type 2 diabetes mellitus with diabetic chronic kidney disease: Secondary | ICD-10-CM

## 2017-06-25 MED ORDER — CYCLOBENZAPRINE HCL 10 MG PO TABS: ORAL_TABLET | ORAL | 0 refills | Status: DC

## 2017-06-25 MED ORDER — DILTIAZEM HCL ER 180 MG PO CP24: 180.0000 mg | ORAL_CAPSULE | Freq: Every day | ORAL | 0 refills | Status: DC

## 2017-06-25 MED ORDER — PRAVASTATIN SODIUM 40 MG PO TABS: 40.0000 mg | ORAL_TABLET | Freq: Every day | ORAL | 0 refills | Status: DC

## 2017-06-25 MED ORDER — LOSARTAN POTASSIUM-HCTZ 50-12.5 MG PO TABS: 1.0000 | ORAL_TABLET | Freq: Every day | ORAL | 0 refills | Status: DC

## 2017-06-25 MED ORDER — DULAGLUTIDE 1.5 MG/0.5ML ~~LOC~~ SOAJ: 1.5000 mg | SUBCUTANEOUS | 0 refills | Status: DC

## 2017-06-25 MED ORDER — METFORMIN HCL ER 750 MG PO TB24: 750.0000 mg | ORAL_TABLET | Freq: Every day | ORAL | 0 refills | Status: DC

## 2017-06-25 MED ORDER — DESVENLAFAXINE SUCCINATE ER 50 MG PO TB24: 50.0000 mg | ORAL_TABLET | ORAL | 0 refills | Status: DC

## 2017-06-25 MED ORDER — LEVOTHYROXINE SODIUM 112 MCG PO TABS: 112.0000 ug | ORAL_TABLET | Freq: Every day | ORAL | 0 refills | Status: DC

## 2017-06-25 NOTE — Telephone Encounter (Signed)
Hello from [place], We made it here and living out of boxes, it will take months to finally get finished. I keep getting different info from [place] and [place] pharmacies. Please if you will can all my prescriptions to [facility], they apparently have no problem with different states.   Metformin.              And.    Desveniafaxine  Pravastatin.              And.    Dulaglutide  Losartan hydrochlorothiazide.                  And.   Diltiazem  Cyclobenzaprine.                                And.   Synthoroid   PLEASE SEND TO [place], any problem give me a call.  [phone].   Thanks

## 2017-07-10 ENCOUNTER — Ambulatory Visit: Payer: Self-pay | Admitting: Family Medicine

## 2017-07-23 IMAGING — CR DG MANDIBLE 4+V
1 series · 4 of 4 positions shown · non-contrast
Comparison: None.

CLINICAL DATA: Left jaw pain for 4 months.

EXAM:
MANDIBLE - 4+ VIEW

[Series 1: dg mandible 4 views · 0.14mm/px · 4 of 4 slices shown]
[im 1/4]
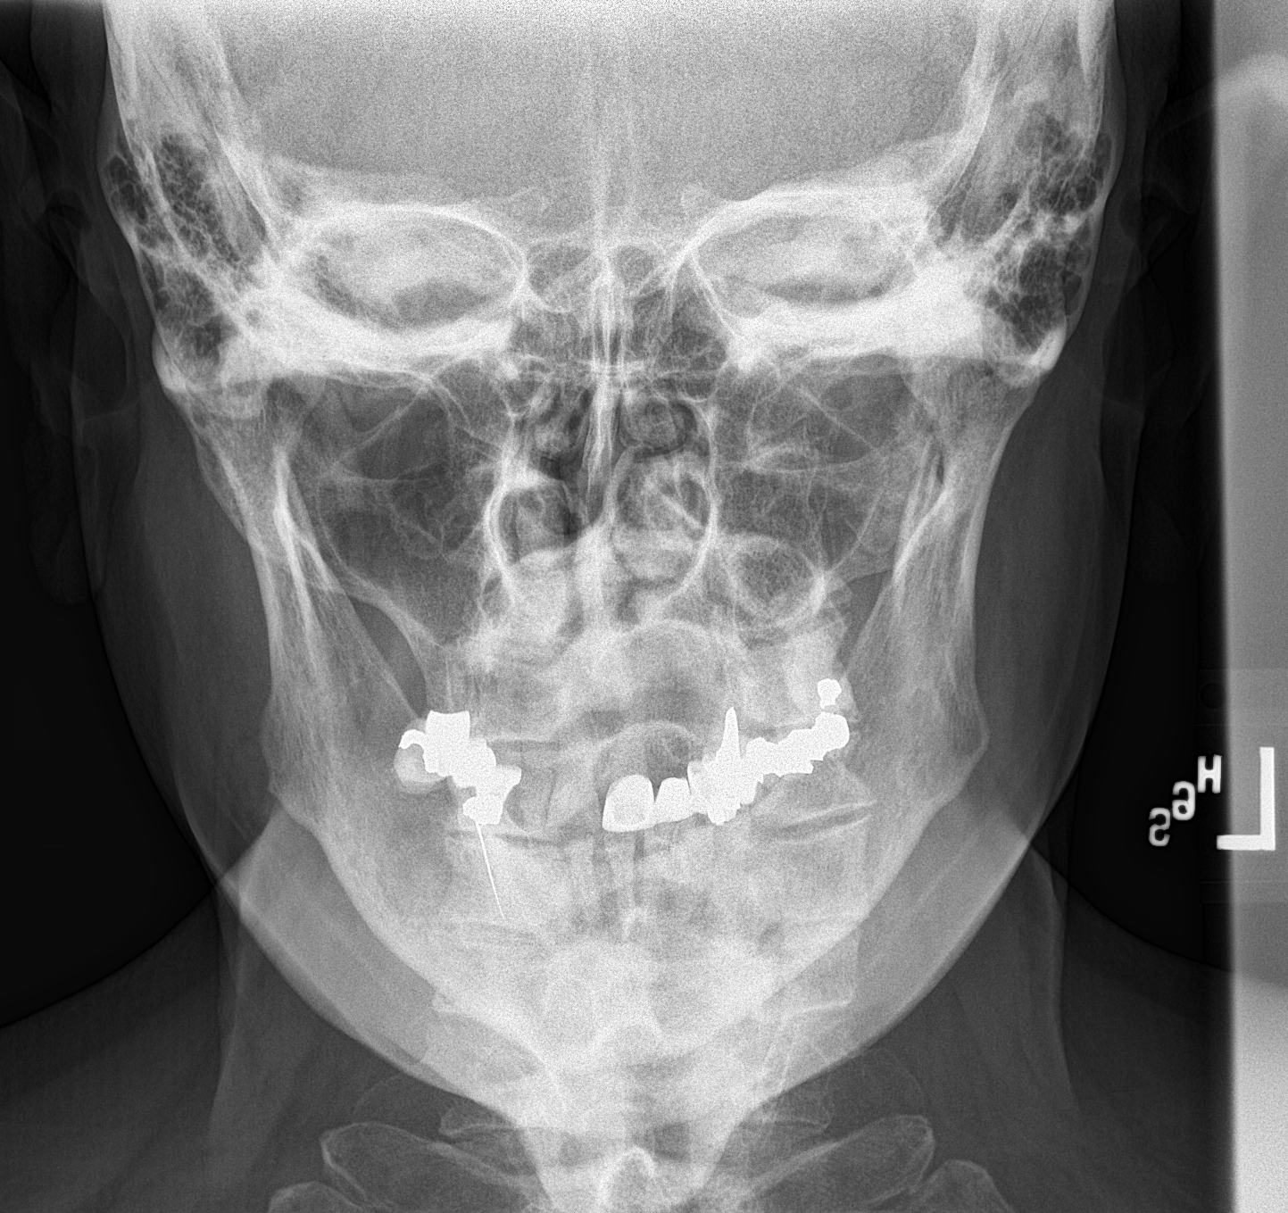
[im 2/4]
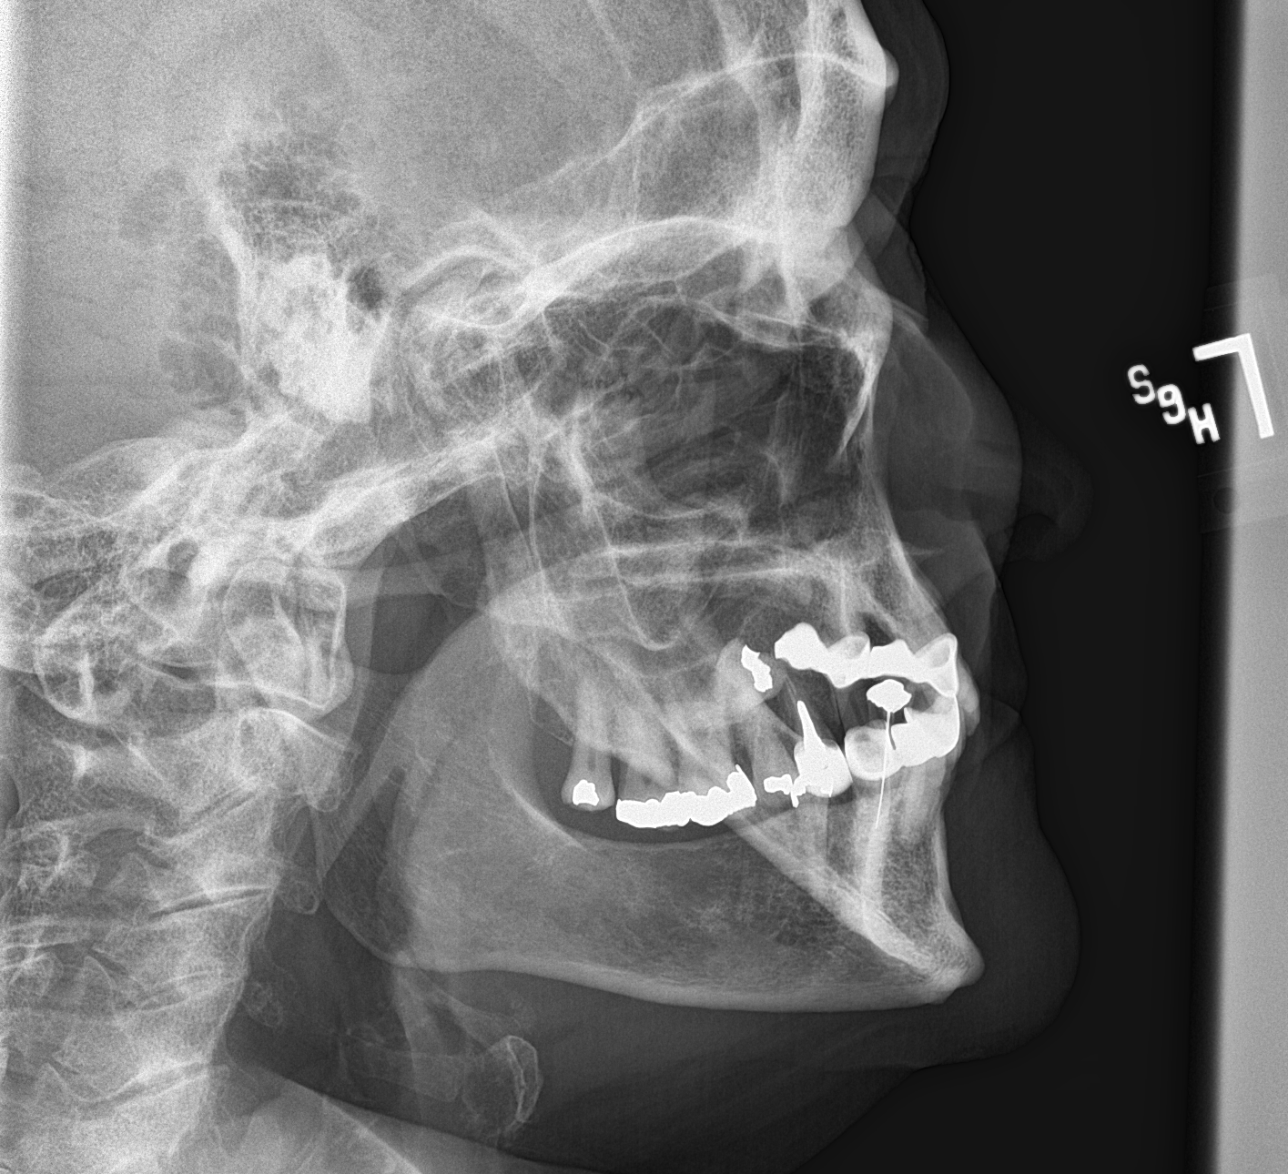
[im 3/4]
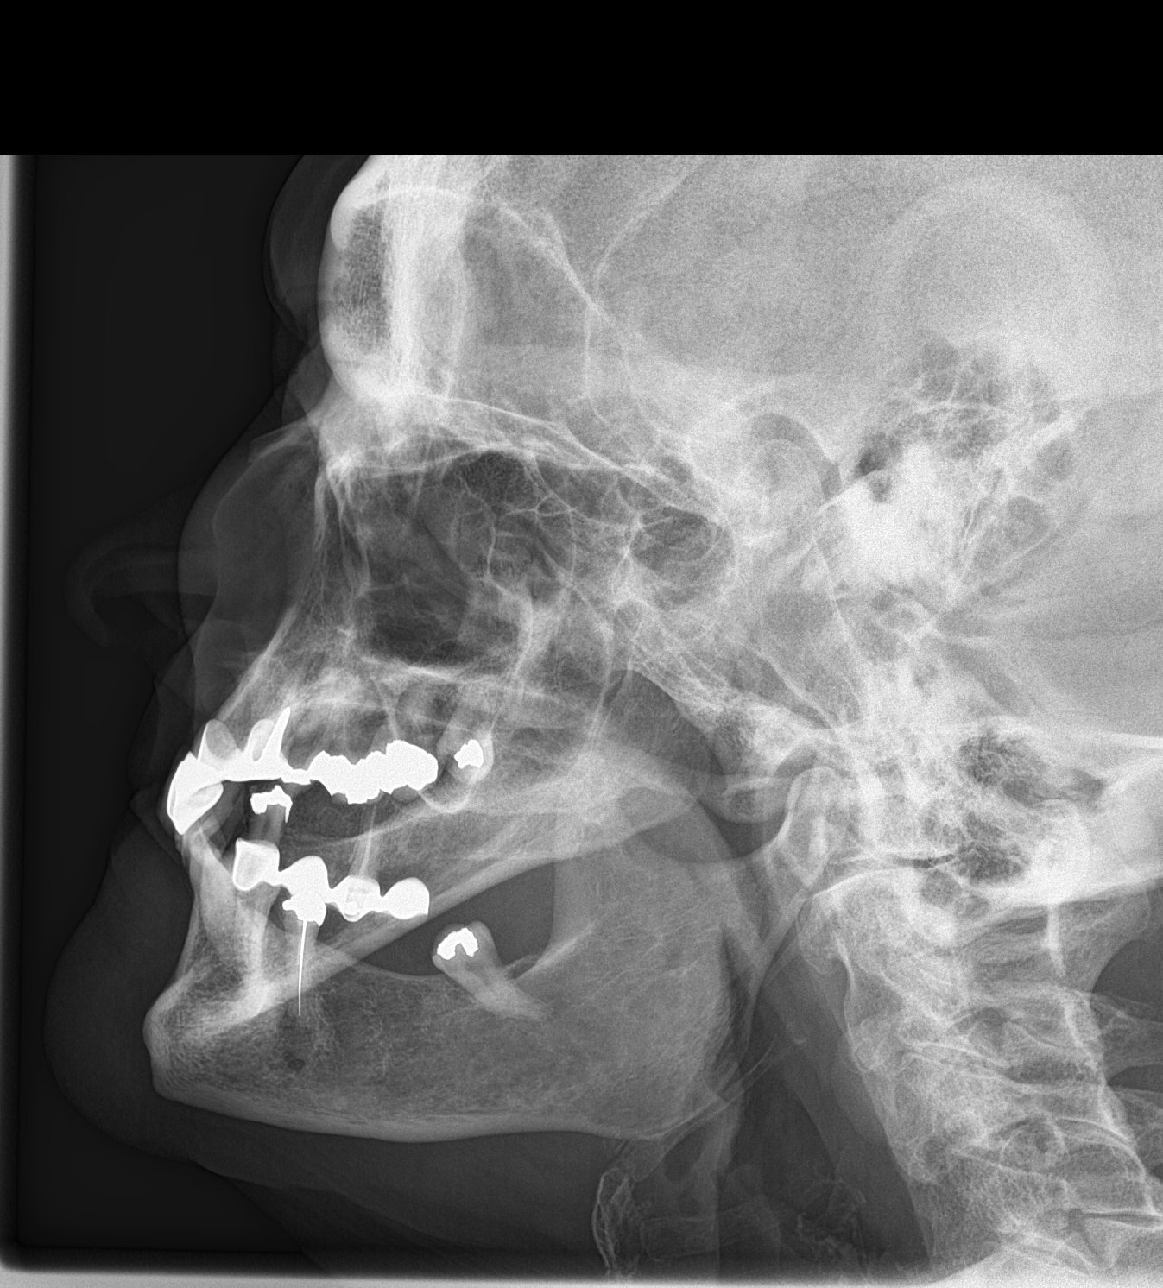
[im 4/4]
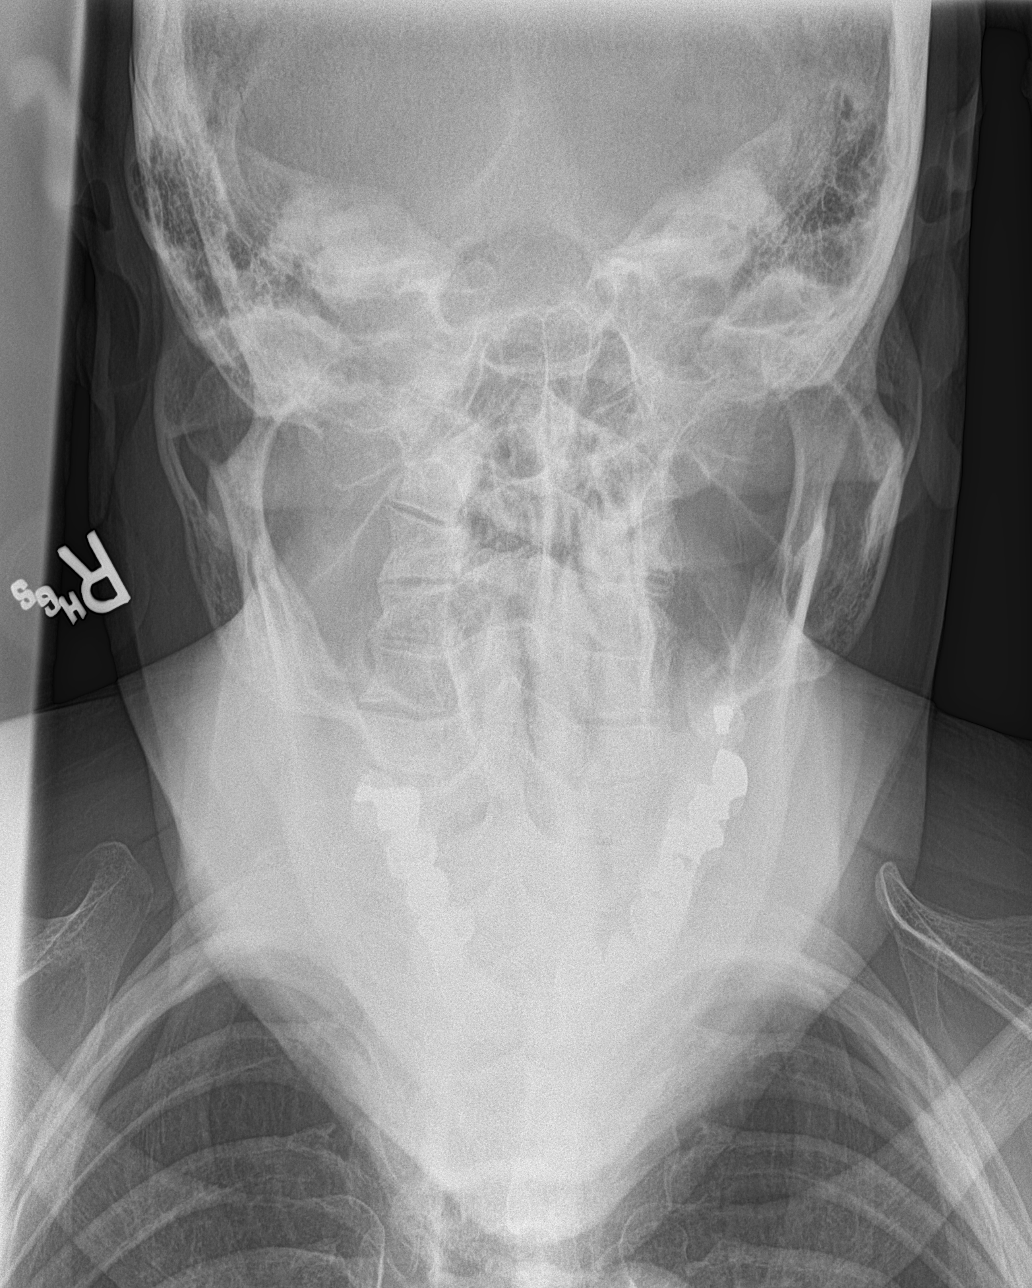

[4 of 4 positions shown; findings below may reference images not displayed]

FINDINGS: There is no evidence of fracture or other focal bone lesions.
IMPRESSION: Negative.

## 2017-07-24 ENCOUNTER — Other Ambulatory Visit: Payer: Self-pay | Admitting: Family Medicine

## 2017-08-05 ENCOUNTER — Other Ambulatory Visit: Payer: Self-pay | Admitting: Family Medicine

## 2017-08-05 ENCOUNTER — Encounter: Payer: Self-pay | Admitting: Family Medicine

## 2017-08-31 ENCOUNTER — Encounter: Payer: Self-pay | Admitting: Family Medicine

## 2017-09-02 ENCOUNTER — Encounter: Payer: Self-pay | Admitting: Family Medicine

## 2017-09-08 ENCOUNTER — Other Ambulatory Visit: Payer: Self-pay | Admitting: Family Medicine

## 2017-09-08 DIAGNOSIS — N183 Chronic kidney disease, stage 3 (moderate): Principal | ICD-10-CM

## 2017-09-08 DIAGNOSIS — E1122 Type 2 diabetes mellitus with diabetic chronic kidney disease: Secondary | ICD-10-CM

## 2017-09-10 DIAGNOSIS — Z1211 Encounter for screening for malignant neoplasm of colon: Secondary | ICD-10-CM | POA: Diagnosis not present

## 2017-09-10 DIAGNOSIS — E1122 Type 2 diabetes mellitus with diabetic chronic kidney disease: Secondary | ICD-10-CM | POA: Diagnosis not present

## 2017-09-10 DIAGNOSIS — L4050 Arthropathic psoriasis, unspecified: Secondary | ICD-10-CM | POA: Diagnosis not present

## 2017-09-10 DIAGNOSIS — Z1231 Encounter for screening mammogram for malignant neoplasm of breast: Secondary | ICD-10-CM | POA: Diagnosis not present

## 2017-09-13 ENCOUNTER — Other Ambulatory Visit: Payer: Self-pay | Admitting: Family Medicine

## 2017-09-13 DIAGNOSIS — I10 Essential (primary) hypertension: Secondary | ICD-10-CM

## 2017-09-13 DIAGNOSIS — E039 Hypothyroidism, unspecified: Secondary | ICD-10-CM

## 2017-09-30 DIAGNOSIS — I10 Essential (primary) hypertension: Secondary | ICD-10-CM | POA: Diagnosis not present

## 2017-09-30 DIAGNOSIS — E1122 Type 2 diabetes mellitus with diabetic chronic kidney disease: Secondary | ICD-10-CM | POA: Diagnosis not present

## 2017-09-30 DIAGNOSIS — E890 Postprocedural hypothyroidism: Secondary | ICD-10-CM | POA: Diagnosis not present

## 2017-09-30 DIAGNOSIS — E785 Hyperlipidemia, unspecified: Secondary | ICD-10-CM | POA: Diagnosis not present

## 2017-10-01 DIAGNOSIS — R293 Abnormal posture: Secondary | ICD-10-CM | POA: Diagnosis not present

## 2017-10-01 DIAGNOSIS — M546 Pain in thoracic spine: Secondary | ICD-10-CM | POA: Diagnosis not present

## 2017-10-01 DIAGNOSIS — M2560 Stiffness of unspecified joint, not elsewhere classified: Secondary | ICD-10-CM | POA: Diagnosis not present

## 2017-10-01 DIAGNOSIS — G8929 Other chronic pain: Secondary | ICD-10-CM | POA: Diagnosis not present

## 2017-10-02 ENCOUNTER — Other Ambulatory Visit: Payer: Self-pay | Admitting: Family Medicine

## 2017-10-02 DIAGNOSIS — E1122 Type 2 diabetes mellitus with diabetic chronic kidney disease: Secondary | ICD-10-CM | POA: Diagnosis not present

## 2017-10-02 DIAGNOSIS — I10 Essential (primary) hypertension: Secondary | ICD-10-CM | POA: Diagnosis not present

## 2017-10-02 DIAGNOSIS — E890 Postprocedural hypothyroidism: Secondary | ICD-10-CM | POA: Diagnosis not present

## 2017-10-02 DIAGNOSIS — F331 Major depressive disorder, recurrent, moderate: Secondary | ICD-10-CM

## 2017-10-02 DIAGNOSIS — Z1159 Encounter for screening for other viral diseases: Secondary | ICD-10-CM | POA: Diagnosis not present

## 2017-10-02 DIAGNOSIS — N183 Chronic kidney disease, stage 3 (moderate): Secondary | ICD-10-CM | POA: Diagnosis not present

## 2017-10-04 ENCOUNTER — Other Ambulatory Visit: Payer: Self-pay | Admitting: Family Medicine

## 2017-10-04 DIAGNOSIS — N183 Chronic kidney disease, stage 3 (moderate): Principal | ICD-10-CM

## 2017-10-04 DIAGNOSIS — E1122 Type 2 diabetes mellitus with diabetic chronic kidney disease: Secondary | ICD-10-CM

## 2017-10-05 DIAGNOSIS — F458 Other somatoform disorders: Secondary | ICD-10-CM | POA: Diagnosis not present

## 2017-10-05 DIAGNOSIS — R490 Dysphonia: Secondary | ICD-10-CM | POA: Diagnosis not present

## 2017-10-05 DIAGNOSIS — K219 Gastro-esophageal reflux disease without esophagitis: Secondary | ICD-10-CM | POA: Diagnosis not present

## 2017-10-07 DIAGNOSIS — M2041 Other hammer toe(s) (acquired), right foot: Secondary | ICD-10-CM | POA: Diagnosis not present

## 2017-10-07 DIAGNOSIS — E1122 Type 2 diabetes mellitus with diabetic chronic kidney disease: Secondary | ICD-10-CM | POA: Diagnosis not present

## 2017-10-07 DIAGNOSIS — L84 Corns and callosities: Secondary | ICD-10-CM | POA: Diagnosis not present

## 2017-10-07 DIAGNOSIS — G629 Polyneuropathy, unspecified: Secondary | ICD-10-CM | POA: Diagnosis not present

## 2017-10-16 DIAGNOSIS — M79643 Pain in unspecified hand: Secondary | ICD-10-CM | POA: Diagnosis not present

## 2017-10-16 DIAGNOSIS — M25569 Pain in unspecified knee: Secondary | ICD-10-CM | POA: Diagnosis not present

## 2017-10-16 DIAGNOSIS — M810 Age-related osteoporosis without current pathological fracture: Secondary | ICD-10-CM | POA: Diagnosis not present

## 2017-10-16 DIAGNOSIS — Z1389 Encounter for screening for other disorder: Secondary | ICD-10-CM | POA: Diagnosis not present

## 2017-10-16 DIAGNOSIS — M5440 Lumbago with sciatica, unspecified side: Secondary | ICD-10-CM | POA: Diagnosis not present

## 2017-10-16 DIAGNOSIS — L4050 Arthropathic psoriasis, unspecified: Secondary | ICD-10-CM | POA: Diagnosis not present

## 2017-10-22 DIAGNOSIS — E039 Hypothyroidism, unspecified: Secondary | ICD-10-CM | POA: Diagnosis not present

## 2017-10-22 DIAGNOSIS — Z5181 Encounter for therapeutic drug level monitoring: Secondary | ICD-10-CM | POA: Diagnosis not present

## 2017-10-22 DIAGNOSIS — M130 Polyarthritis, unspecified: Secondary | ICD-10-CM | POA: Diagnosis not present

## 2017-10-22 DIAGNOSIS — N183 Chronic kidney disease, stage 3 (moderate): Secondary | ICD-10-CM | POA: Diagnosis not present

## 2017-10-22 DIAGNOSIS — Z13818 Encounter for screening for other digestive system disorders: Secondary | ICD-10-CM | POA: Diagnosis not present

## 2017-10-22 DIAGNOSIS — D649 Anemia, unspecified: Secondary | ICD-10-CM | POA: Diagnosis not present

## 2017-10-22 DIAGNOSIS — M1990 Unspecified osteoarthritis, unspecified site: Secondary | ICD-10-CM | POA: Diagnosis not present

## 2017-10-22 DIAGNOSIS — R7982 Elevated C-reactive protein (CRP): Secondary | ICD-10-CM | POA: Diagnosis not present

## 2017-10-22 DIAGNOSIS — E1122 Type 2 diabetes mellitus with diabetic chronic kidney disease: Secondary | ICD-10-CM | POA: Diagnosis not present

## 2017-10-23 ENCOUNTER — Other Ambulatory Visit: Payer: Self-pay | Admitting: Family Medicine

## 2017-10-23 DIAGNOSIS — E1169 Type 2 diabetes mellitus with other specified complication: Secondary | ICD-10-CM

## 2017-10-23 DIAGNOSIS — E785 Hyperlipidemia, unspecified: Principal | ICD-10-CM

## 2017-10-27 DIAGNOSIS — D485 Neoplasm of uncertain behavior of skin: Secondary | ICD-10-CM | POA: Diagnosis not present

## 2017-10-27 DIAGNOSIS — L578 Other skin changes due to chronic exposure to nonionizing radiation: Secondary | ICD-10-CM | POA: Diagnosis not present

## 2017-10-27 DIAGNOSIS — L408 Other psoriasis: Secondary | ICD-10-CM | POA: Diagnosis not present

## 2017-11-03 DIAGNOSIS — F329 Major depressive disorder, single episode, unspecified: Secondary | ICD-10-CM | POA: Diagnosis not present

## 2017-11-06 DIAGNOSIS — F331 Major depressive disorder, recurrent, moderate: Secondary | ICD-10-CM | POA: Diagnosis not present

## 2017-11-06 DIAGNOSIS — F411 Generalized anxiety disorder: Secondary | ICD-10-CM | POA: Diagnosis not present

## 2017-11-10 DIAGNOSIS — R490 Dysphonia: Secondary | ICD-10-CM | POA: Diagnosis not present

## 2017-11-10 DIAGNOSIS — F458 Other somatoform disorders: Secondary | ICD-10-CM | POA: Diagnosis not present

## 2017-11-10 DIAGNOSIS — K219 Gastro-esophageal reflux disease without esophagitis: Secondary | ICD-10-CM | POA: Diagnosis not present

## 2017-11-16 DIAGNOSIS — K589 Irritable bowel syndrome without diarrhea: Secondary | ICD-10-CM | POA: Diagnosis not present

## 2017-11-21 ENCOUNTER — Other Ambulatory Visit: Payer: Self-pay | Admitting: Family Medicine

## 2017-11-21 DIAGNOSIS — E039 Hypothyroidism, unspecified: Secondary | ICD-10-CM

## 2017-11-22 NOTE — Telephone Encounter (Addendum)
Tiffany,   Pleas notify patient this is the last refill . I told her I could refill for a few months, but I have not seen her since 05/2017

## 2017-12-01 ENCOUNTER — Other Ambulatory Visit: Payer: Self-pay | Admitting: Family Medicine

## 2017-12-01 DIAGNOSIS — E1122 Type 2 diabetes mellitus with diabetic chronic kidney disease: Secondary | ICD-10-CM

## 2017-12-01 DIAGNOSIS — N183 Chronic kidney disease, stage 3 (moderate): Principal | ICD-10-CM

## 2017-12-02 DIAGNOSIS — Z780 Asymptomatic menopausal state: Secondary | ICD-10-CM | POA: Diagnosis not present

## 2017-12-02 DIAGNOSIS — M79642 Pain in left hand: Secondary | ICD-10-CM | POA: Diagnosis not present

## 2017-12-02 DIAGNOSIS — M79641 Pain in right hand: Secondary | ICD-10-CM | POA: Diagnosis not present

## 2017-12-02 DIAGNOSIS — M25531 Pain in right wrist: Secondary | ICD-10-CM | POA: Diagnosis not present

## 2017-12-02 DIAGNOSIS — M419 Scoliosis, unspecified: Secondary | ICD-10-CM | POA: Diagnosis not present

## 2017-12-02 DIAGNOSIS — M47816 Spondylosis without myelopathy or radiculopathy, lumbar region: Secondary | ICD-10-CM | POA: Diagnosis not present

## 2017-12-02 DIAGNOSIS — M25562 Pain in left knee: Secondary | ICD-10-CM | POA: Diagnosis not present

## 2017-12-02 DIAGNOSIS — M25561 Pain in right knee: Secondary | ICD-10-CM | POA: Diagnosis not present

## 2017-12-02 DIAGNOSIS — L4050 Arthropathic psoriasis, unspecified: Secondary | ICD-10-CM | POA: Diagnosis not present

## 2017-12-02 DIAGNOSIS — Z13820 Encounter for screening for osteoporosis: Secondary | ICD-10-CM | POA: Diagnosis not present

## 2017-12-02 DIAGNOSIS — M25532 Pain in left wrist: Secondary | ICD-10-CM | POA: Diagnosis not present

## 2017-12-11 DIAGNOSIS — F411 Generalized anxiety disorder: Secondary | ICD-10-CM | POA: Diagnosis not present

## 2017-12-11 DIAGNOSIS — F331 Major depressive disorder, recurrent, moderate: Secondary | ICD-10-CM | POA: Diagnosis not present

## 2017-12-19 ENCOUNTER — Other Ambulatory Visit: Payer: Self-pay | Admitting: Family Medicine

## 2017-12-19 DIAGNOSIS — I10 Essential (primary) hypertension: Secondary | ICD-10-CM

## 2017-12-25 DIAGNOSIS — F331 Major depressive disorder, recurrent, moderate: Secondary | ICD-10-CM | POA: Diagnosis not present

## 2017-12-25 DIAGNOSIS — F411 Generalized anxiety disorder: Secondary | ICD-10-CM | POA: Diagnosis not present

## 2018-01-08 DIAGNOSIS — E559 Vitamin D deficiency, unspecified: Secondary | ICD-10-CM | POA: Diagnosis not present

## 2018-01-08 DIAGNOSIS — M79643 Pain in unspecified hand: Secondary | ICD-10-CM | POA: Diagnosis not present

## 2018-01-08 DIAGNOSIS — Z5181 Encounter for therapeutic drug level monitoring: Secondary | ICD-10-CM | POA: Diagnosis not present

## 2018-01-08 DIAGNOSIS — M25569 Pain in unspecified knee: Secondary | ICD-10-CM | POA: Diagnosis not present

## 2018-01-08 DIAGNOSIS — M810 Age-related osteoporosis without current pathological fracture: Secondary | ICD-10-CM | POA: Diagnosis not present

## 2018-01-08 DIAGNOSIS — Z1389 Encounter for screening for other disorder: Secondary | ICD-10-CM | POA: Diagnosis not present

## 2018-01-08 DIAGNOSIS — M5440 Lumbago with sciatica, unspecified side: Secondary | ICD-10-CM | POA: Diagnosis not present

## 2018-01-08 DIAGNOSIS — L4050 Arthropathic psoriasis, unspecified: Secondary | ICD-10-CM | POA: Diagnosis not present

## 2018-01-10 ENCOUNTER — Other Ambulatory Visit: Payer: Self-pay | Admitting: Family Medicine

## 2018-01-10 DIAGNOSIS — E1169 Type 2 diabetes mellitus with other specified complication: Secondary | ICD-10-CM

## 2018-01-10 DIAGNOSIS — E1122 Type 2 diabetes mellitus with diabetic chronic kidney disease: Secondary | ICD-10-CM

## 2018-01-10 DIAGNOSIS — N183 Chronic kidney disease, stage 3 (moderate): Secondary | ICD-10-CM

## 2018-01-10 DIAGNOSIS — F331 Major depressive disorder, recurrent, moderate: Secondary | ICD-10-CM

## 2018-01-10 DIAGNOSIS — E785 Hyperlipidemia, unspecified: Secondary | ICD-10-CM

## 2018-01-13 DIAGNOSIS — Z1389 Encounter for screening for other disorder: Secondary | ICD-10-CM | POA: Diagnosis not present

## 2018-01-13 DIAGNOSIS — M2550 Pain in unspecified joint: Secondary | ICD-10-CM | POA: Diagnosis not present

## 2018-01-13 DIAGNOSIS — D8989 Other specified disorders involving the immune mechanism, not elsewhere classified: Secondary | ICD-10-CM | POA: Diagnosis not present

## 2018-01-18 DIAGNOSIS — E890 Postprocedural hypothyroidism: Secondary | ICD-10-CM | POA: Diagnosis not present

## 2018-01-18 DIAGNOSIS — E1122 Type 2 diabetes mellitus with diabetic chronic kidney disease: Secondary | ICD-10-CM | POA: Diagnosis not present

## 2018-01-18 DIAGNOSIS — I10 Essential (primary) hypertension: Secondary | ICD-10-CM | POA: Diagnosis not present

## 2018-01-18 DIAGNOSIS — N183 Chronic kidney disease, stage 3 (moderate): Secondary | ICD-10-CM | POA: Diagnosis not present

## 2018-01-18 DIAGNOSIS — E785 Hyperlipidemia, unspecified: Secondary | ICD-10-CM | POA: Diagnosis not present

## 2018-01-27 DIAGNOSIS — F329 Major depressive disorder, single episode, unspecified: Secondary | ICD-10-CM | POA: Diagnosis not present

## 2018-01-29 DIAGNOSIS — F331 Major depressive disorder, recurrent, moderate: Secondary | ICD-10-CM | POA: Diagnosis not present

## 2018-01-29 DIAGNOSIS — F411 Generalized anxiety disorder: Secondary | ICD-10-CM | POA: Diagnosis not present

## 2018-02-12 DIAGNOSIS — S80869A Insect bite (nonvenomous), unspecified lower leg, initial encounter: Secondary | ICD-10-CM | POA: Diagnosis not present

## 2018-02-12 DIAGNOSIS — L408 Other psoriasis: Secondary | ICD-10-CM | POA: Diagnosis not present

## 2018-02-15 DIAGNOSIS — E1122 Type 2 diabetes mellitus with diabetic chronic kidney disease: Secondary | ICD-10-CM | POA: Diagnosis not present

## 2018-02-15 DIAGNOSIS — Z1231 Encounter for screening mammogram for malignant neoplasm of breast: Secondary | ICD-10-CM | POA: Diagnosis not present

## 2018-02-15 DIAGNOSIS — Z23 Encounter for immunization: Secondary | ICD-10-CM | POA: Diagnosis not present

## 2018-02-15 DIAGNOSIS — E538 Deficiency of other specified B group vitamins: Secondary | ICD-10-CM | POA: Diagnosis not present

## 2018-02-16 DIAGNOSIS — F331 Major depressive disorder, recurrent, moderate: Secondary | ICD-10-CM | POA: Diagnosis not present

## 2018-02-16 DIAGNOSIS — F411 Generalized anxiety disorder: Secondary | ICD-10-CM | POA: Diagnosis not present

## 2018-02-24 DIAGNOSIS — Z1231 Encounter for screening mammogram for malignant neoplasm of breast: Secondary | ICD-10-CM | POA: Diagnosis not present

## 2018-03-02 DIAGNOSIS — F331 Major depressive disorder, recurrent, moderate: Secondary | ICD-10-CM | POA: Diagnosis not present

## 2018-03-02 DIAGNOSIS — F411 Generalized anxiety disorder: Secondary | ICD-10-CM | POA: Diagnosis not present

## 2018-03-10 DIAGNOSIS — E538 Deficiency of other specified B group vitamins: Secondary | ICD-10-CM | POA: Diagnosis not present

## 2018-03-10 DIAGNOSIS — E559 Vitamin D deficiency, unspecified: Secondary | ICD-10-CM | POA: Diagnosis not present

## 2018-03-10 DIAGNOSIS — R5383 Other fatigue: Secondary | ICD-10-CM | POA: Diagnosis not present

## 2018-03-12 DIAGNOSIS — M810 Age-related osteoporosis without current pathological fracture: Secondary | ICD-10-CM | POA: Diagnosis not present

## 2018-03-12 DIAGNOSIS — E559 Vitamin D deficiency, unspecified: Secondary | ICD-10-CM | POA: Diagnosis not present

## 2018-03-12 DIAGNOSIS — M25569 Pain in unspecified knee: Secondary | ICD-10-CM | POA: Diagnosis not present

## 2018-03-12 DIAGNOSIS — M5440 Lumbago with sciatica, unspecified side: Secondary | ICD-10-CM | POA: Diagnosis not present

## 2018-03-12 DIAGNOSIS — Z5181 Encounter for therapeutic drug level monitoring: Secondary | ICD-10-CM | POA: Diagnosis not present

## 2018-03-12 DIAGNOSIS — Z1389 Encounter for screening for other disorder: Secondary | ICD-10-CM | POA: Diagnosis not present

## 2018-03-12 DIAGNOSIS — L4050 Arthropathic psoriasis, unspecified: Secondary | ICD-10-CM | POA: Diagnosis not present

## 2018-03-12 DIAGNOSIS — M79643 Pain in unspecified hand: Secondary | ICD-10-CM | POA: Diagnosis not present

## 2018-03-18 DIAGNOSIS — F331 Major depressive disorder, recurrent, moderate: Secondary | ICD-10-CM | POA: Diagnosis not present

## 2018-03-18 DIAGNOSIS — F411 Generalized anxiety disorder: Secondary | ICD-10-CM | POA: Diagnosis not present

## 2018-03-20 ENCOUNTER — Other Ambulatory Visit: Payer: Self-pay | Admitting: Family Medicine

## 2018-03-20 DIAGNOSIS — I10 Essential (primary) hypertension: Secondary | ICD-10-CM

## 2018-04-07 DIAGNOSIS — N183 Chronic kidney disease, stage 3 (moderate): Secondary | ICD-10-CM | POA: Diagnosis not present

## 2018-04-07 DIAGNOSIS — E1122 Type 2 diabetes mellitus with diabetic chronic kidney disease: Secondary | ICD-10-CM | POA: Diagnosis not present

## 2018-04-07 DIAGNOSIS — M2041 Other hammer toe(s) (acquired), right foot: Secondary | ICD-10-CM | POA: Diagnosis not present

## 2018-04-07 DIAGNOSIS — E890 Postprocedural hypothyroidism: Secondary | ICD-10-CM | POA: Diagnosis not present

## 2018-04-07 DIAGNOSIS — L84 Corns and callosities: Secondary | ICD-10-CM | POA: Diagnosis not present

## 2018-04-07 DIAGNOSIS — G629 Polyneuropathy, unspecified: Secondary | ICD-10-CM | POA: Diagnosis not present

## 2018-04-12 DIAGNOSIS — I10 Essential (primary) hypertension: Secondary | ICD-10-CM | POA: Diagnosis not present

## 2018-04-12 DIAGNOSIS — E890 Postprocedural hypothyroidism: Secondary | ICD-10-CM | POA: Diagnosis not present

## 2018-04-12 DIAGNOSIS — E1122 Type 2 diabetes mellitus with diabetic chronic kidney disease: Secondary | ICD-10-CM | POA: Diagnosis not present

## 2018-04-12 DIAGNOSIS — F411 Generalized anxiety disorder: Secondary | ICD-10-CM | POA: Diagnosis not present

## 2018-04-12 DIAGNOSIS — E785 Hyperlipidemia, unspecified: Secondary | ICD-10-CM | POA: Diagnosis not present

## 2018-04-12 DIAGNOSIS — F331 Major depressive disorder, recurrent, moderate: Secondary | ICD-10-CM | POA: Diagnosis not present

## 2018-04-14 DIAGNOSIS — H40013 Open angle with borderline findings, low risk, bilateral: Secondary | ICD-10-CM | POA: Diagnosis not present

## 2018-04-14 DIAGNOSIS — H2513 Age-related nuclear cataract, bilateral: Secondary | ICD-10-CM | POA: Diagnosis not present

## 2018-04-16 ENCOUNTER — Other Ambulatory Visit: Payer: Self-pay | Admitting: Family Medicine

## 2018-04-16 DIAGNOSIS — E039 Hypothyroidism, unspecified: Secondary | ICD-10-CM

## 2018-04-20 DIAGNOSIS — E1165 Type 2 diabetes mellitus with hyperglycemia: Secondary | ICD-10-CM | POA: Diagnosis not present

## 2018-04-21 DIAGNOSIS — F329 Major depressive disorder, single episode, unspecified: Secondary | ICD-10-CM | POA: Diagnosis not present

## 2018-05-02 ENCOUNTER — Other Ambulatory Visit: Payer: Self-pay | Admitting: Family Medicine

## 2018-05-03 DIAGNOSIS — G5702 Lesion of sciatic nerve, left lower limb: Secondary | ICD-10-CM | POA: Diagnosis not present

## 2018-05-10 DIAGNOSIS — F411 Generalized anxiety disorder: Secondary | ICD-10-CM | POA: Diagnosis not present

## 2018-05-10 DIAGNOSIS — F331 Major depressive disorder, recurrent, moderate: Secondary | ICD-10-CM | POA: Diagnosis not present

## 2018-06-04 DIAGNOSIS — Z1389 Encounter for screening for other disorder: Secondary | ICD-10-CM | POA: Diagnosis not present

## 2018-06-04 DIAGNOSIS — Z5181 Encounter for therapeutic drug level monitoring: Secondary | ICD-10-CM | POA: Diagnosis not present

## 2018-06-04 DIAGNOSIS — M79643 Pain in unspecified hand: Secondary | ICD-10-CM | POA: Diagnosis not present

## 2018-06-04 DIAGNOSIS — M25552 Pain in left hip: Secondary | ICD-10-CM | POA: Diagnosis not present

## 2018-06-04 DIAGNOSIS — M5440 Lumbago with sciatica, unspecified side: Secondary | ICD-10-CM | POA: Diagnosis not present

## 2018-06-04 DIAGNOSIS — M810 Age-related osteoporosis without current pathological fracture: Secondary | ICD-10-CM | POA: Diagnosis not present

## 2018-06-04 DIAGNOSIS — E559 Vitamin D deficiency, unspecified: Secondary | ICD-10-CM | POA: Diagnosis not present

## 2018-06-04 DIAGNOSIS — M25569 Pain in unspecified knee: Secondary | ICD-10-CM | POA: Diagnosis not present

## 2018-06-04 DIAGNOSIS — M533 Sacrococcygeal disorders, not elsewhere classified: Secondary | ICD-10-CM | POA: Diagnosis not present

## 2018-06-04 DIAGNOSIS — L4050 Arthropathic psoriasis, unspecified: Secondary | ICD-10-CM | POA: Diagnosis not present

## 2018-06-04 DIAGNOSIS — M7062 Trochanteric bursitis, left hip: Secondary | ICD-10-CM | POA: Diagnosis not present

## 2018-06-07 DIAGNOSIS — F331 Major depressive disorder, recurrent, moderate: Secondary | ICD-10-CM | POA: Diagnosis not present

## 2018-06-07 DIAGNOSIS — F411 Generalized anxiety disorder: Secondary | ICD-10-CM | POA: Diagnosis not present

## 2018-06-15 ENCOUNTER — Other Ambulatory Visit: Payer: Self-pay | Admitting: Family Medicine

## 2018-06-15 DIAGNOSIS — I10 Essential (primary) hypertension: Secondary | ICD-10-CM

## 2018-06-15 DIAGNOSIS — F329 Major depressive disorder, single episode, unspecified: Secondary | ICD-10-CM | POA: Diagnosis not present

## 2018-07-09 DIAGNOSIS — E890 Postprocedural hypothyroidism: Secondary | ICD-10-CM | POA: Diagnosis not present

## 2018-07-09 DIAGNOSIS — E1122 Type 2 diabetes mellitus with diabetic chronic kidney disease: Secondary | ICD-10-CM | POA: Diagnosis not present

## 2018-07-09 DIAGNOSIS — N183 Chronic kidney disease, stage 3 (moderate): Secondary | ICD-10-CM | POA: Diagnosis not present

## 2018-07-13 DIAGNOSIS — H40013 Open angle with borderline findings, low risk, bilateral: Secondary | ICD-10-CM | POA: Diagnosis not present

## 2018-07-20 DIAGNOSIS — I10 Essential (primary) hypertension: Secondary | ICD-10-CM | POA: Diagnosis not present

## 2018-07-20 DIAGNOSIS — E785 Hyperlipidemia, unspecified: Secondary | ICD-10-CM | POA: Diagnosis not present

## 2018-07-20 DIAGNOSIS — E1122 Type 2 diabetes mellitus with diabetic chronic kidney disease: Secondary | ICD-10-CM | POA: Diagnosis not present

## 2018-07-20 DIAGNOSIS — E890 Postprocedural hypothyroidism: Secondary | ICD-10-CM | POA: Diagnosis not present

## 2018-08-03 DIAGNOSIS — L408 Other psoriasis: Secondary | ICD-10-CM | POA: Diagnosis not present

## 2018-08-03 DIAGNOSIS — L578 Other skin changes due to chronic exposure to nonionizing radiation: Secondary | ICD-10-CM | POA: Diagnosis not present

## 2018-08-09 DIAGNOSIS — F329 Major depressive disorder, single episode, unspecified: Secondary | ICD-10-CM | POA: Diagnosis not present

## 2018-08-16 DIAGNOSIS — E1122 Type 2 diabetes mellitus with diabetic chronic kidney disease: Secondary | ICD-10-CM | POA: Diagnosis not present

## 2018-08-16 DIAGNOSIS — N183 Chronic kidney disease, stage 3 (moderate): Secondary | ICD-10-CM | POA: Diagnosis not present

## 2018-08-16 DIAGNOSIS — I10 Essential (primary) hypertension: Secondary | ICD-10-CM | POA: Diagnosis not present

## 2018-08-16 DIAGNOSIS — E890 Postprocedural hypothyroidism: Secondary | ICD-10-CM | POA: Diagnosis not present

## 2018-08-23 DIAGNOSIS — F411 Generalized anxiety disorder: Secondary | ICD-10-CM | POA: Diagnosis not present

## 2018-08-23 DIAGNOSIS — F331 Major depressive disorder, recurrent, moderate: Secondary | ICD-10-CM | POA: Diagnosis not present
